# Patient Record
Sex: Female | Born: 1970 | Race: Black or African American | Hispanic: No | Marital: Single | Smoking: Never smoker
Health system: Southern US, Community
[De-identification: ages and names within clinical notes are randomized; demographics above are authoritative.]

## PROBLEM LIST (undated history)

## (undated) DIAGNOSIS — F32A Depression, unspecified: Secondary | ICD-10-CM

## (undated) DIAGNOSIS — F329 Major depressive disorder, single episode, unspecified: Secondary | ICD-10-CM

## (undated) DIAGNOSIS — F419 Anxiety disorder, unspecified: Secondary | ICD-10-CM

## (undated) HISTORY — PX: TUBAL LIGATION: SHX77

## (undated) HISTORY — PX: EYE MUSCLE SURGERY: SHX370

---

## 2001-02-28 ENCOUNTER — Emergency Department (HOSPITAL_COMMUNITY): Admission: EM | Admit: 2001-02-28 | Discharge: 2001-02-28 | Payer: Self-pay | Admitting: Emergency Medicine

## 2001-03-10 ENCOUNTER — Encounter: Admission: RE | Admit: 2001-03-10 | Discharge: 2001-04-04 | Payer: Self-pay | Admitting: Family Medicine

## 2001-03-19 ENCOUNTER — Other Ambulatory Visit: Admission: RE | Admit: 2001-03-19 | Discharge: 2001-03-19 | Payer: Self-pay | Admitting: Ophthalmology

## 2001-10-17 ENCOUNTER — Inpatient Hospital Stay (HOSPITAL_COMMUNITY): Admission: EM | Admit: 2001-10-17 | Discharge: 2001-10-20 | Payer: Self-pay | Admitting: Psychiatry

## 2003-05-05 ENCOUNTER — Other Ambulatory Visit: Admission: RE | Admit: 2003-05-05 | Discharge: 2003-05-05 | Payer: Self-pay | Admitting: Obstetrics and Gynecology

## 2003-12-06 ENCOUNTER — Inpatient Hospital Stay (HOSPITAL_COMMUNITY): Admission: AD | Admit: 2003-12-06 | Discharge: 2003-12-11 | Payer: Self-pay | Admitting: Obstetrics and Gynecology

## 2003-12-07 ENCOUNTER — Encounter (INDEPENDENT_AMBULATORY_CARE_PROVIDER_SITE_OTHER): Payer: Self-pay | Admitting: Specialist

## 2003-12-17 ENCOUNTER — Ambulatory Visit (HOSPITAL_COMMUNITY): Admission: RE | Admit: 2003-12-17 | Discharge: 2003-12-17 | Payer: Self-pay | Admitting: Obstetrics and Gynecology

## 2004-01-24 ENCOUNTER — Ambulatory Visit: Payer: Self-pay | Admitting: Internal Medicine

## 2008-02-08 ENCOUNTER — Inpatient Hospital Stay (HOSPITAL_COMMUNITY): Admission: AD | Admit: 2008-02-08 | Discharge: 2008-02-11 | Payer: Self-pay | Admitting: Obstetrics and Gynecology

## 2008-02-08 ENCOUNTER — Encounter (INDEPENDENT_AMBULATORY_CARE_PROVIDER_SITE_OTHER): Payer: Self-pay | Admitting: Obstetrics and Gynecology

## 2009-05-24 ENCOUNTER — Ambulatory Visit (HOSPITAL_COMMUNITY): Admission: RE | Admit: 2009-05-24 | Discharge: 2009-05-24 | Payer: Self-pay | Admitting: Obstetrics and Gynecology

## 2009-06-28 ENCOUNTER — Emergency Department (HOSPITAL_COMMUNITY): Admission: EM | Admit: 2009-06-28 | Discharge: 2009-06-28 | Payer: Self-pay | Admitting: Emergency Medicine

## 2009-07-25 ENCOUNTER — Encounter: Admission: RE | Admit: 2009-07-25 | Discharge: 2009-08-25 | Payer: Self-pay | Admitting: Family Medicine

## 2010-01-28 ENCOUNTER — Emergency Department (HOSPITAL_COMMUNITY)
Admission: EM | Admit: 2010-01-28 | Discharge: 2010-01-28 | Payer: Self-pay | Source: Home / Self Care | Admitting: Emergency Medicine

## 2010-04-04 LAB — CBC
HCT: 35.2 % — ABNORMAL LOW (ref 36.0–46.0)
Hemoglobin: 11.6 g/dL — ABNORMAL LOW (ref 12.0–15.0)
MCHC: 33 g/dL (ref 30.0–36.0)
MCV: 83.1 fL (ref 78.0–100.0)
Platelets: 233 10*3/uL (ref 150–400)
RBC: 4.23 MIL/uL (ref 3.87–5.11)
RDW: 15.2 % (ref 11.5–15.5)
WBC: 4.8 10*3/uL (ref 4.0–10.5)

## 2010-04-04 LAB — PREGNANCY, URINE: Preg Test, Ur: NEGATIVE

## 2010-05-01 LAB — CBC
HCT: 27.7 % — ABNORMAL LOW (ref 36.0–46.0)
HCT: 34.4 % — ABNORMAL LOW (ref 36.0–46.0)
Hemoglobin: 11.2 g/dL — ABNORMAL LOW (ref 12.0–15.0)
Hemoglobin: 9 g/dL — ABNORMAL LOW (ref 12.0–15.0)
MCHC: 32.5 g/dL (ref 30.0–36.0)
MCHC: 32.7 g/dL (ref 30.0–36.0)
MCV: 88.5 fL (ref 78.0–100.0)
MCV: 90 fL (ref 78.0–100.0)
Platelets: 165 10*3/uL (ref 150–400)
Platelets: 187 10*3/uL (ref 150–400)
RBC: 3.08 MIL/uL — ABNORMAL LOW (ref 3.87–5.11)
RBC: 3.89 MIL/uL (ref 3.87–5.11)
RDW: 14.8 % (ref 11.5–15.5)
RDW: 15.4 % (ref 11.5–15.5)
WBC: 11.7 10*3/uL — ABNORMAL HIGH (ref 4.0–10.5)
WBC: 8.6 10*3/uL (ref 4.0–10.5)

## 2010-05-01 LAB — RPR: RPR Ser Ql: NONREACTIVE

## 2010-05-30 NOTE — Op Note (Signed)
NAME:  [name], [name]                ACCOUNT NO.:  [id number]   MEDICAL RECORD NO.:  [id number]          PATIENT TYPE:  INP   LOCATION:  9108                          FACILITY:  [facility]   PHYSICIAN:  Hal Morales, M.D.DATE OF BIRTH:  [birth year]   DATE OF PROCEDURE:  02/08/2008  DATE OF DISCHARGE:                               OPERATIVE REPORT   PREOPERATIVE DIAGNOSES:  Intrauterine pregnancy at term, spontaneous  labor, spontaneous rupture of membranes, prior cesarean section x3,  desire for repeat cesarean section, and desire for surgical  sterilization.   POSTOPERATIVE DIAGNOSES:  Intrauterine pregnancy at term, spontaneous  labor, spontaneous rupture of membranes, prior cesarean section x3,  desire for repeat cesarean section, and desire for surgical  sterilization.   OPERATION:  Repeat low transverse cesarean section and bilateral tubal  sterilization.   SURGEON:  Hal Morales, MD   FIRST ASSISTANT:  Elby Showers. Mayford Knife, CNM   ANESTHESIA:  Spinal.   ESTIMATED BLOOD LOSS:  750 mL.   COMPLICATIONS:  None.   FINDINGS:  The patient was delivered of a female infant weighing 10 pounds  5 ounces with Apgars of 9 and 9 at 1 and 5 minutes respectively.  The  uterus, tubes, and ovaries were normal for the gravid state.   PROCEDURE IN DETAIL:  The patient was taken to the operating room after  appropriate identification, and placed on the operating table.  She had  received 2 g of Ancef prior to incision.  The abdomen and perineum were  prepped with multiple layers of Betadine and a Foley catheter was  inserted into the bladder under sterile conditions and connected to  straight drainage.  The abdomen was draped as a sterile field.  After  assurance of adequate anesthesia, the site of the previous cesarean  section incision was infiltrated with 20 mL of 0.25% Marcaine.  A  transverse incision was made at that site and the abdomen opened in  layers.  The peritoneum was entered  and the bladder blade placed.  The  uterus was incised approximately 2 cm above the uterovesical fold and  that incision taken laterally on either side bluntly.  The infant was  delivered from the occiput transverse position with the aid of a Kiwi  vacuum extractor.  The nares and pharynx were suctioned and the cord  clamped and cut.  The infant was handed off to the awaiting  pediatricians.  The appropriate cord blood was drawn and a true knot  noted in the umbilical cord.  The placenta was allowed to separate from  the uterus and was removed from the operative field.  Remaining  membranes were then removed with a Kelly clamp.  The uterine incision  was closed with a running interlocking suture of 0 Vicryl.  An  imbricating suture of 0 Vicryl was then placed.  Hemostatic sutures of 0  Vicryl were placed in a figure-of-eight fashion to achieve adequate  hemostasis.  The left fallopian tube was then identified, followed to  its fimbriated end, and grasped at the isthmic portion.  A suture of 2-0  chromic was placed through the mesosalpinx and tied fore and aft on the  knuckle of tube.  A second ligature was placed proximal to that and the  intervening knuckle of tube excised.  The cut ends were cauterized.  A  similar procedure was carried out on the opposite side.  Copious  irrigation of the peritoneal cavity was carried out and the abdominal  peritoneum closed with a running suture of 2-0 Vicryl.  The rectus  fascia was then closed with a running suture of 0 Vicryl and reinforced  on either side of midline with figure-of-eight sutures of 0 Vicryl after  the rectus muscles had been irrigated and made hemostatic with Bovie  cautery.  The subcutaneous tissue was irrigated and made hemostatic with  Bovie cautery.  The skin incision was closed with a subcuticular suture  of 3-0 Monocryl.  Steri-Strips were applied to the incision and a  sterile dressing applied.  The patient was then taken  from the operating  room to the recovery room in satisfactory condition having tolerated the  procedure well with sponge and instrument counts correct.  The placenta  went to [facility].  The infant went to the Full-Term Nursery.      Hal Morales, M.D.  Electronically Signed     VPH/MEDQ  D:  02/08/2008  T:  02/08/2008  Job:  [id number]

## 2010-05-30 NOTE — Discharge Summary (Signed)
[name], Parsons                ACCOUNT NO.:  [id number]   MEDICAL RECORD NO.:  [id number]          PATIENT TYPE:  INP   LOCATION:  9108                          [facility]ILITY:  [facility]   PHYSICIAN:  Janine Limbo, M.D.DATE OF BIRTH:  10/14/1970   DATE OF ADMISSION:  02/08/2008  DATE OF DISCHARGE:  02/11/2008                               DISCHARGE SUMMARY   Ms. [name] is a 40 year old gravida 4, para 3-0-0-3 at 66 and 5/7th  weeks who presented with complaints of leaking fluid and contractions.  She reports positive fetal movement and had nothing by mouth after  midnight.  Pregnancy has been remarkable for:  1. Late care.  2. Advanced maternal age.  3. Unsure dates.  4. Previous C-section x3.  5. HSV-1 and 2.  6. Polyhydramnios and LGA  7. Bilateral pyelectasis.  8. Also, the patient desired tubal sterilization.   The patient presented early in the morning of February 08, 2008, with  ruptured membranes and contractions.  Cervix was 1-2, 80%, and vertex -  3.  There was some clear light brown fluid noted.  There were no HSV  lesions noted.  She was having contractions every 2 minutes.  Fetal  heart rate was reassuring.  The decision was made to proceed with  cesarean section and tubal sterilization per the patient's request.  The  patient was taken to the operating room where Dr. Pennie Rushing performed a  repeat low transverse cesarean section and tubal sterilization.  Findings were a viable female, Apgars were 9 and 9, and weight was 10  pounds 5 ounces.  This was done under spinal anesthesia.  Infant was  taken to the full-term nursery.  Mother was taken to recovery in good  condition.  By postop day #1, the patient was doing well.  She was up ad  lib.  No syncope or dizziness.  She was bottle-feeding.  Her hemoglobin  was 9. down from 11.2, white blood cell count 11.7, and platelet count  of 165.  Her incision was clean, dry, and intact.  Fundus was firm.  Lochia was scant.   Orthostatics were done and were stable.  She was  recommended strongly iron supplementation.  Rest of the patient's  hospital stay was uncomplicated.  She again continued to have no syncope  or dizziness.  By postop day #3, she was doing well.  Infant was also  feeding well.  The patient's physical exam was within normal limits with  the fundus firm.  Incision was clean, dry, and intact.  Bleeding was  minimal.  She was deemed to receive full benefit of her hospital stay  and was discharged home.   DISCHARGE INSTRUCTIONS:  Per [facility] handout.   DISCHARGE MEDICATIONS:  1. Motrin 600 mg p.o. q.6 h. p.r.n. pain.  2. Percocet 1-2 p.o. q.3-4 h. p.r.n. pain.  3. Integra F 1 p.o. daily.   DISCHARGE FOLLOWUP:  Occur in 6 weeks at [facility] or p.r.n.      Renaldo Reel Emilee Hero, C.N.M.      Janine Limbo, M.D.  Electronically  Signed    VLL/MEDQ  D:  02/11/2008  T:  02/12/2008  Job:  [id number]

## 2010-05-30 NOTE — H&P (Signed)
[name], [name]                ACCOUNT NO.:  [id number]   MEDICAL RECORD NO.:  [id number]          PATIENT TYPE:  INP   LOCATION:  9108                          [facility]ILITY:  [facility]   PHYSICIAN:  Hal Morales, M.D.DATE OF BIRTH:  Dec 21, 1970   DATE OF ADMISSION:  02/08/2008  DATE OF DISCHARGE:                              HISTORY & PHYSICAL   This is a 40 year old gravida 4, para 3-0-0-3 at 27 and 5/7th weeks who  presents with complaints of leaking fluid and contractions.  She reports  positive fetal movement and she has been in nothing by mouth after  midnight.  Pregnancy has been remarkable for,  1. Late to care.  2. AMA.  3. Unsure dates.  4. Previous C-section x3.  5. HSV I and II.  6. Polyhydramnios and LGA.  7. Bilateral pyelectasis.   ALLERGIES:  None.   OB HISTORY:  Remarkable for C-section of a female infant in 37 at 7  weeks' gestation weighing 7 pounds 8 ounces.  She had a C-section in  1998 of a female infant at 15 weeks' gestation weighing 8 pounds 0  ounces and she had a C-section in 2005 of a female infant at 49 weeks'  gestation weighing 9 pounds 6 ounces.   MEDICAL HISTORY:  Remarkable for history of abnormal Pap in 2005,  history of Trichomonas in the past, childhood varicella, and history of  depression in the past, not requiring medications right now.   SURGICAL HISTORY:  Remarkable for C-section x3.   FAMILY HISTORY:  Remarkable for hypertension in her mother and brother,  TB in her grandmother, diabetes in her mother and several brothers,  seizures in her brother, bipolar in her brother.   GENETIC HISTORY:  Negative except for the patient's age of 52.   SOCIAL HISTORY:  The patient is single.  Father of baby, Geralyn Flash is involved and supportive.  She is of the [place] faith.  She denies any alcohol, tobacco, or drug use.   PRENATAL LABORATORIES:  Hemoglobin 11.7, platelets 170.  Blood type O  positive.  Antibody screen negative.  RPR  nonreactive.  Rubella immune.  Hepatitis negative.  HIV negative.  HSV I and II positive.  Hepatitis C  negative.   HISTORY OF CURRENT PREGNANCY:  The patient entered care at 42 weeks'  gestation.  She had a Glucola done that was normal.  She had ultrasound  at 32 weeks showing AFI of 95 percentile and LGA.  Another ultrasound at  33 weeks showed vertex presentation, BPP of 8/8, and a normal AFI.  BTL  papers were signed at 35 weeks, and she presents today in labor.   OBJECTIVE:  VITAL SIGNS:  Stable, afebrile.  HEENT:  Within normal limits.  NECK:  Thyroid normal not enlarged.  CHEST:  Clear to auscultation.  HEART:  Regular rate and rhythm.  ABDOMEN:  Gravid.  GU:  EFM shows nonreactive fetal heart rate with good variability and no  D cells.  Uterine contractions are every 2 minutes.  Cervix is 1-2, 80%,  -3, questionable vertex with a  difficult exam.  We will do bedside  ultrasound to confirm.  EXTREMITIES:  Within normal limits.  There are no HSV lesions.   ASSESSMENT:  1. Intrauterine pregnancy at 17 and 5/7th weeks.  2. Labor.  3. Prior cesarean section x3.  4. Desires bilateral tubal ligation with paper signed on January 14, 2008.   PLAN:  1. Admit to birthing suites per Dr. Pennie Rushing.  2. Routine preop orders.      Marie L. Williams, C.N.M.      Hal Morales, M.D.  Electronically Signed    MLW/MEDQ  D:  02/08/2008  T:  02/08/2008  Job:  [id number]

## 2010-06-02 NOTE — Discharge Summary (Signed)
[name], [name]                ACCOUNT NO.:  [id number]   MEDICAL RECORD NO.:  [id number]          PATIENT TYPE:  INP   LOCATION:  9119                          [facility]   PHYSICIAN:  Hal Morales, M.D.DATE OF BIRTH:  [birth year]   DATE OF ADMISSION:  12/06/2003  DATE OF DISCHARGE:  12/11/2003                                 DISCHARGE SUMMARY   ADMISSION DIAGNOSES:  1.  Intrauterine pregnancy at term.  2.  Pregnancy induced hypertension with proteinuria.  3.  Early labor.   DISCHARGE DIAGNOSES:  1.  Intrauterine pregnancy at term.  2.  Pregnancy induced hypertension with proteinuria.  3.  Early labor.  4.  Failure to progress in labor.  5.  Repeat cesarean section for delivery.  6.  Cystotomy repair.  7.  Bottle feeding.  8.  Resolved postoperative ileus.   PROCEDURE:  1.  Repeat low transverse cesarean section for delivery of a viable female      infant named [name] who had Apgar's of 9 and 9 and weighed 9 pounds 2      ounces. Attended in delivery by Dr. Osborn Coho and Wynelle Bourgeois,      C.N.M.  2.  Procedure was complicated by a cystotomy, which was repaired during the      surgery.   HOSPITAL COURSE:  Ms. [name] is a 40 year old gravida 3, para 2, 0/0/2 at 14  and 1/7 weeks admitted for observation initially secondary to elevated blood  pressures, who progressed in labor to 5 cm, desiring a vaginal birth attempt  after previous cesarean section. She got to 5 cm by 7:40 a.m. on December 07, 2003 and underwent amniotomy for augmentation and did indeed have  adequate MVU's for greater than 2 hours with no further cervical change and  was then recommended to proceed with a cesarean section for delivery. She  underwent the same for delivery of a viable female infant named [name] who had  Apgar's of 9 and 9 and weighed 9 pounds 2 ounces and was attended in  delivery by Dr. Osborn Coho and Wynelle Bourgeois, C.N.M. The procedure was  complicated by a  cystotomy that was repaired during the procedure and  subsequently, the patient has continued with a Foley bag for drainage while  her cystotomy is healing. She is now tolerating a regular diet and  ambulating without difficulty. She did have a CT scan on December 09, 2003  due to increasing abdominal pain postoperatively that showed a hematoma  within the right rectus muscle about 6 x 2 cm and a diffuse ileus and  gallstones and small bilateral pleural effusions. She also had a cystic mass  in the left upper quadrant, possibly pancreatic in origin and MRI is  recommended for further evaluation in the future. She has now been afebrile  for 48 hours and is deemed ready for discharge. She is bottle feeding  without difficulty. Her blood pressures have also been relatively stable.  She is now, as I said before, deemed ready for discharge.   DISCHARGE INSTRUCTIONS:  As per the [facility] handout with the addition to call for any nausea,  vomiting or fever or any issues with her Foley and Foley bag. She is to go  home with the Foley inserted at least 7 more days and will be given a leg  bag to go home with.   DISCHARGE MEDICATIONS:  1.  Hydrochlorothiazide 25 mg p.o. q.d.  2.  KCL 20 meq b.i.d. p.o.  3.  Ciprofloxacin 250 mg p.o. b.i.d. for 5 days.  4.  Tylox 1 to 2 p.o. q. 4 to 6 hours p.r.n. for pain.  5.  Ibuprofen 600 mg p.o. q. 6 hours p.r.n. for pain.  6.  She has also been give a prescription for Lo/Ovral birth control pills      to start in about 4 weeks.   DISCHARGE LABORATORY DATA:  Her potassium today is 3.0. Her hemoglobin on  discharge is 6.3. White blood cell count 10.4 and platelets are 214,000.   FOLLOW UP:  Will be an appointment for cystogram on December 17, 2003 at 9:00  a.m. at [facility] and immediately following the  cystogram, she is to have an appointment with Dr. Su Hilt at [facility]  and [facility]ologic Services.   CONDITION ON DISCHARGE:  Well and stable.     Shel   SJD/MEDQ  D:  12/11/2003  T:  12/11/2003  Job:  [id number]

## 2010-06-02 NOTE — H&P (Signed)
[name], [name]                ACCOUNT NO.:  [id number]   MEDICAL RECORD NO.:  [id number]          PATIENT TYPE:  INP   LOCATION:  9174                          [facility]ILITY[facility]  [facility]   PHYSICIAN:  Cam Hai, C.N.M.  DATE OF BIRTH:  1970/12/15   DATE OF ADMISSION:  12/06/2003  DATE OF DISCHARGE:                                HISTORY & PHYSICAL   Audio too short to transcribe (less than 5 seconds)      KS/MEDQ  D:  12/06/2003  T:  12/06/2003  Job:  [id number]

## 2010-06-02 NOTE — Discharge Summary (Signed)
NAME:  [name], [name] NO.:  [id number]   MEDICAL RECORD NO.:  [id number]                   PATIENT TYPE:  IPS   LOCATION:  0504                                 FACILITY:  [facility]   PHYSICIAN:  Jeanice Lim, M.D.              DATE OF BIRTH:  [birth year]   DATE OF ADMISSION:  10/17/2001  DATE OF DISCHARGE:  10/20/2001                                 DISCHARGE SUMMARY   IDENTIFYING DATA:  This is a 40 year old single African-American female  voluntarily admitted, presenting with a history of intentional overdose with  sleeping medicine with intention to kill himself.   MEDICATIONS:  Zoloft (which he felt was not effective).   ALLERGIES:  No known drug allergies.   PHYSICAL EXAMINATION:  Essentially within normal limits.  Neurologically  nonfocal.   LABORATORY DATA:  CBC within normal limits.  Alcohol level less than 5.  CMET within normal limits.  Urine drug screen negative.   MENTAL STATUS EXAM:  Alert, young, African-American female.  Cooperative,  calm with good eye contact.  Speech clear and articulate.  Mood depressed.  Affect flat, occasionally brightened.  Thought processes goal directed.  Thought content negative for dangerous ideation or psychotic symptoms.  Cognitively intact.  Judgment and insight fair.   ADMISSION DIAGNOSES:   AXIS I:  Major depressive disorder, single, severe.   AXIS II:  None.   AXIS III:  None.   AXIS IV:  Moderate (problems with primary support group, occupational  problems, economic problems).   AXIS V:  30/70.   HOSPITAL COURSE:  The patient was admitted and ordered routine p.r.n.  medications and underwent further monitoring.  Was encouraged to participate  in individual, group and milieu therapy.  The patient denied trying to kill  herself with the overdose.  She reported taking three 10 mg Ambien and then  calling a friend.  Seemed somewhat out of it and friend, therefore, assisted  her in getting to  the hospital.  The patient was quite appropriate on the  unit, participating fully in treatment and was titrated on Lexapro targeting  depressive symptoms.  The patient reported tolerance to medications, no  dangerous ideation.   CONDITION ON DISCHARGE:  Improved.  Mood was more euthymic.  Affect  brighter.  Thought processes goal directed.  Thought content negative for  dangerous ideation or psychotic symptoms.  The patient reported motivation  to be compliant with follow-up plan.    DISCHARGE MEDICATIONS:  1. Lexapro 10 mg q.a.m.  2. Vistaril 50 mg q.h.s. p.r.n.   FOLLOW UP:  Dr. Evelene Croon on October 23, 2001 at 4 p.m.   DISCHARGE DIAGNOSES:   AXIS I:  Major depressive disorder, single, severe.   AXIS II:  None.   AXIS III:  None.   AXIS IV:  Moderate (problems with primary support group, occupational  problems, economic problems).   AXIS V:  Global Assessment of Functioning on discharge 55.                                                Jeanice Lim, M.D.    JEM/MEDQ  D:  11/26/2001  T:  11/26/2001  Job:  [id number]

## 2010-06-02 NOTE — Op Note (Signed)
[name], [name]                ACCOUNT NO.:  [id number]   MEDICAL RECORD NO.:  [id number]          PATIENT TYPE:  INP   LOCATION:  9119                          [facility]   PHYSICIAN:  Osborn Coho, M.D.   DATE OF BIRTH:  01-01-1971   DATE OF PROCEDURE:  12/07/2003  DATE OF DISCHARGE:                                 OPERATIVE REPORT   PREOPERATIVE DIAGNOSES:  1.  Term intrauterine pregnancy.  2.  Repeat cesarean section.  3.  Attempted vaginal birth after cesarean delivery with failure to      progress.   POSTOPERATIVE DIAGNOSES:  1.  Term intrauterine pregnancy.  2.  Repeat cesarean section.  3.  Attempted vaginal birth after cesarean delivery with failure to      progress.  4.  Direct occiput posterior presentation.   PROCEDURE:  Repeat cesarean section with cystotomy repair.   ANESTHESIA:  Epidural.   ATTENDING:  Osborn Coho, M.D.   FLUIDS:  1500 mL.   ESTIMATED BLOOD LOSS:  1200 mL.   URINE OUTPUT:  250 mL.   COMPLICATIONS:  Less than or equal to 5 mm incidental cystotomy.   FINDINGS:  Live female infant with Apgars of 9 at one minute and 9 at five  minutes, Jayden, weight 9 pounds 2 ounces.   PROCEDURE:  The patient was taken to the operating room after the risks,  benefits, and alternatives discussed with the patient, patient verbalized  understanding, and consent signed and witnessed.  The patient was given a  surgical level via epidural and prepped and draped in the normal sterile  fashion.  A Pfannenstiel skin incision was made at the prior scar.  The  incision was carried down to the underlying layer of fascia, which was  excised bilaterally in the midline and extended bilaterally with the Mayo  scissors.  Kocher clamps were placed on the superior aspect of the fascial  incision and the rectus muscle excised from this fascia.  The same was done  on the inferior aspect of the fascial incision.  The muscles were separated  in the midline and the  peritoneum entered bluntly.  A bladder blade was  placed and bladder flap attempted; however, dense scar tissue of bladder to  lower uterine segment.  A decision was made to make uterine incision  approximately a centimeter above the upper edge of the bladder.  The uterine  incision was made and extended bilaterally with the bandage scissors.  Membranes were ruptured and infant was delivered in a direct OP position  with vacuum assistance secondary to very difficult to elevate the head  through the incision with just fundal pressure.  I think it was at that time  when some of the scar tissue tore away from between the lower uterine  segment and the bladder and less than a 5 mm incidental cystotomy performed.  The infant was delivered and handed to the waiting pediatricians.  Cord  bloods were collected.  The placenta was delivered via fundal massage and  sent to pathology.  The uterus was cleared of all clots and  debris.  The  uterine incision was repaired with 0 Vicryl in a running locked fashion.  At  this time sterile milk was placed through the Foley and incidental cystotomy  noted.  Cystotomy was repaired in three layers with interrupteds using 2-0  Vicryl on the first layer.  A second imbricating layer was performed and a  third layer using interrupteds was performed as well.  After the repair,  sterile milk was placed in the Foley once again and no leak noted at this  time.  A second imbricating layer was performed on the uterine incision  using 0 Vicryl.  The intra-abdominal cavity was copiously irrigated and the  right fallopian tube and ovary appeared normal and the left fallopian tube  and ovary palpated to within normal limits.  The peritoneum was closed with  3-0 chromic in a running fashion.  The fascia was repaired with 0 Vicryl in  a running fashion.  The subcutaneous tissue was irrigated and made  hemostatic with the Bovie.  A subcuticular stitch was performed on the skin   using 3-0 Monocryl.  Sponge, lap, and needle count was correct.  The patient  tolerated the procedure well and was returned to the recovery room in good  condition.  The Foley will remain for a total of 10 days, and the patient  will be scheduled to follow up in the office.     Ange   AR/MEDQ  D:  12/07/2003  T:  12/08/2003  Job:  [id number]
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
## 2010-06-02 NOTE — H&P (Signed)
[name], [name] NO.:  [id number]   MEDICAL RECORD NO.:  [id number]                   PATIENT TYPE:  IPS   LOCATION:  0504                                 FACILITY:  [facility]   PHYSICIAN:  Landry Corporal, N.P.                 DATE OF BIRTH:  July 05, 1970   DATE OF ADMISSION:  10/17/2001  DATE OF DISCHARGE:                         PSYCHIATRIC ADMISSION ASSESSMENT   IDENTIFYING INFORMATION:  A 40 year old single African-American female,  voluntarily admitted on October 17, 2001.   HISTORY OF PRESENT ILLNESS:  The patient presents with a history of  intentional overdose on her sleeping medication, took 3 of them at home.  She believes it was Ambien.  She was by herself.  She wrote a note to  anyone who found her.  The patient did call a friend, 911 was called and  the patient was then transported to the emergency department.  The patient  states her intention was to kill herself.  She was very stressed over her  finances, feels she is unable to provide for her children.  She is a single  mother.  She has little social support.  She reports that she feels she is  just tired and feels like a failure.  Her sleep has been satisfactory.  Her  appetite has been satisfactory.  She is denying any psychosis and promises  safety on the unit.   PAST PSYCHIATRIC HISTORY:  First hospitalization to [facility], no other inpatient admissions.  No outpatient treatment, no history  of a suicide attempt.  The patient was to make an appointment with a  psychiatrist but did not follow through.   SOCIAL HISTORY:  She is a 40 year old single African-American female with 2  children, ages 27 and 7.  She lives with her children.  Children are  currently with a friend.  The patient has a court date pending for failure  to appear in regards to her driver's license.  Completed 12th grade.  She  works in Clinical biochemist for [employer].   FAMILY HISTORY:  Both her parents  are deceased, unknown psychiatric history.   ALCOHOL DRUG HISTORY:  Nonsmoker, denies any alcohol or substance abuse.   PAST MEDICAL HISTORY:  Primary care Makenna Macaluso is Dr. Alwyn Pea at the  [facility] in [place].  Medical problems are none.   MEDICATIONS:  The patient has been on Zoloft 50 mg, has been on that for  approximately 3 weeks, been off for it for 1 month, found it ineffective,  and a sleeping medication.   DRUG ALLERGIES:  No known allergies.   PHYSICAL EXAMINATION:  Performed at [facility].  The  patient appears a thin, well-nourished female in no acute distress.  Laboratory data:  CBC within normal limits.  Urine pregnancy test was  negative.  CMET within normal limits.  Alcohol level less than 5.  Urinalysis showed positive ketones.  Urine drug screen was negative.  CMET:  Protein elevated at 8.9.   MENTAL STATUS EXAM:  She is an alert, young African-American female,  cooperative with good eye contact, calm.  Speech is clear and articulate.  Mood is depressed, affect is flat with moments of brightness.  Thought  processes are coherent, no evidence of psychosis, no auditory or visual  hallucinations, no suicidal or homicidal ideation, no paranoia.  Cognitive  function intact, memory is good, judgment and insight is fair.  Appears to  have an average to above average intelligence.   ADMISSION DIAGNOSES:   AXIS I:  Major depression with a suicide gesture.   AXIS II:  Deferred.   AXIS III:  None.   AXIS IV:  Problems with primary support group, lack of support, and grief  related to mother's death of last year, occupation, economic, other  psychosocial problems.   AXIS V:  Current is 30, this past year 71-75.   PLAN:  Voluntary admission for intention overdose.  Contract for safety,  check every 15 minutes.  Will obtain labs.  Will initiate Lexapro to  stabilize her mood and thinking.  Medication compliance was discussed.  The   patient to follow up with a private psychiatrist or mental health.  Increase  her coping skills by attending groups.   TENTATIVE LENGTH OF CARE:  4-5 days.                                                Landry Corporal, N.P.    JO/MEDQ  D:  10/18/2001  T:  10/19/2001  Job:  [id number]

## 2010-09-01 ENCOUNTER — Other Ambulatory Visit: Payer: Self-pay | Admitting: Obstetrics and Gynecology

## 2010-11-16 HISTORY — PX: HYSTERECTOMY ABDOMINAL WITH SALPINGECTOMY: SHX6725

## 2010-12-04 ENCOUNTER — Other Ambulatory Visit (HOSPITAL_COMMUNITY): Payer: Managed Care, Other (non HMO)

## 2010-12-05 ENCOUNTER — Encounter (HOSPITAL_COMMUNITY): Payer: Self-pay | Admitting: Pharmacist

## 2010-12-05 NOTE — H&P (Signed)
NAME:  [name], [name]                     ACCOUNT NO.:  MEDICAL RECORD NO.:  [id number]  LOCATION:                                 FACILITY:  PHYSICIAN:  Hal Morales, M.D.DATE OF BIRTH:  1970/07/30  DATE OF ADMISSION:  12/05/2010 DATE OF DISCHARGE:                             HISTORY & PHYSICAL   HISTORY OF PRESENT ILLNESS:  Ms. Komar is a 40 year old single African American female, para 4-0-0-4, presenting for a robot assisted hysterectomy because of menorrhagia and dysmenorrhea.  In 2010, the patient discontinued oral contraceptives due to having undergone a tubal ligation and since that time has had heavy and very uncomfortable menses.  Initially the patient's flow would lasts as long as 2 weeks with the first 7 days being so heavy she had to change her pad every 30 minutes to 1 hour.  The remaining 7 days would be characterized by spotting.  She goes on to say that 2 weeks prior to her flow she  experienced cramping that she rated as a 10/10 on a 10 point pain scale. However, she would find some relief with ibuprofen 400 mg.  Occasionally the patient will experience cramping after her menses as well.  In 2011, the patient underwent NovaSure endometrial ablation, and though her periods were not as heavy they would still last approximately 7 days along with cramping.  She denies any dyspareunia, changes in her bowel movements, urinary tract symptoms, vaginitis symptoms, nausea, vomiting, or diarrhea.  A pelvic ultrasound and sonohysterogram in 2011 showed a uterus measuring 10.2 x 6.50 x 4.81 cm with no observed endometrial masses.  Her right ovary measured 2.83 x 3.49 x 1.80 cm. Left ovary measured 2.90 x 2.19 x 2.82 cm.  An endometrial biopsy done at that same time revealed small fragments of benign proliferative endometrium, but no hyperplasia, atypia, or malignancy was identified. The patient's TSH was also found to be normal at that time.  A review of both medical and  surgical management options were given to the patient, however, due to the fact she has completed her childbearing, she desires to proceed with definitive therapy in the form of hysterectomy.  OB HISTORY:  Gravida 4, para 4-0-0-4.  The patient had a cesarean section in 1990, 1998, 2005, and 2010.  GYN HISTORY:  Menarche at 40 year old.  Last menstrual period on November 24, 2010.  The patient uses tubal ligation as her method of contraception.  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  She has a remote history of an abnormal Pap smear, however, her Pap smears have been normal since, with the most recent being in July, 2012.  PAST MEDICAL HISTORY:  Positive for depression.  The patient also had a suicide attempt in 2003.  SURGICAL HISTORY:  In 1976, eye surgery for strabismus.  In 2010, bilateral tubal ligation.  In 2011, NovaSure endometrial ablation.  She denies any problems with anesthesia, and denies any history of blood transfusions.  FAMILY HISTORY:  Hypertension, tuberculosis, diabetes mellitus, seizure disorder, stroke and bipolar disorder.  SOCIAL HISTORY:  The patient is single and she works in  customer service for [employer]  HABITS:  She does not use alcohol, tobacco, or illicit drugs.  CURRENT ALLERGIES:  None.  ALLERGIES:  She has no known drug allergies.  She denies any sensitivities to latex, soy, peanuts, or shellfish.  REVIEW OF SYSTEMS:  The patient wears  glasses and contact lenses. She denies any headache, vision changes, difficulty swallowing, nasal congestion, nausea, vomiting, diarrhea, chest pain, shortness of breath, urinary frequency, urgency, dysuria, hematochezia, myalgias, arthralgias, skin rashes, and except as is mentioned in history of present illness.  The patient's review of systems is otherwise negative.  PHYSICAL EXAMINATION:  VITAL SIGNS:  Blood pressure 110/70, pulse is 72, respirations 18, temperature  98.8 degrees Fahrenheit orally, weight is 166 pounds.  Height is 5 feet 6-3/4 inches tall, body mass index is 27. NECK:  Supple without masses.  There is no thyromegaly or cervical adenopathy. HEART:  Regular rate and rhythm. LUNGS:  Clear. BACK:  No CVA tenderness. ABDOMEN:  No tenderness, guarding, rebound, masses, or organomegaly. EXTREMITIES:  No clubbing, cyanosis, or edema. PELVIC:  EGBUS is normal.  Vagina was normal with blood noticed in the vaginal vault.  Cervix is nontender without lesions.  Uterus appears upper limits of normal size.  It is mobile without tenderness.  Adnexa without tenderness or masses.  Rectovaginal without any masses.  IMPRESSION: 1. Menorrhagia. 2. Dysmenorrhea 3. Status post NovaSure endometrial ablation.  DISPOSITION:  Discussion was held with the patient regarding indications for her procedure along with its risks, which include, but are not limited to, reaction to anesthesia, damage to adjacent organs, infection, excessive bleeding, and the possible need for an open abdominal incision.  The patient was further advised that she will experience transient postoperative facial edema, that the robotic approach requires more time than that of an open approach, her hospital stay is expected to be 0-2 days, and that she should be able to return to her usual activities in 2-3 weeks.  The patient was given a MiraLax bowel prep to be completed 24 hours prior to her procedure.  The patient verbalized understanding of these risk and instructions and wishes to proceed with a robot-assisted hysterectomy with the possibility of a total abdominal hysterectomy.  The possibility of endometriosis as a cause of her painful, heavy periods could affect her ovaries as well, so the possible need to remove one or both ovaries exists.  This will be performed at [facility] on December 13, 2010, at 10:00 a.m.     Alyxander Kollmann J. Lowell Guitar,  P.A.-C   ______________________________ Hal Morales, M.D.    EJP/MEDQ  D:  12/05/2010  T:  12/05/2010  Job:  [id number]

## 2010-12-06 ENCOUNTER — Encounter (HOSPITAL_COMMUNITY)
Admission: RE | Admit: 2010-12-06 | Discharge: 2010-12-06 | Disposition: A | Discharge status: Home / Self Care | Payer: Managed Care, Other (non HMO) | Source: Ambulatory Visit

## 2010-12-06 HISTORY — DX: Major depressive disorder, single episode, unspecified: F32.9

## 2010-12-06 HISTORY — DX: Depression, unspecified: F32.A

## 2010-12-08 ENCOUNTER — Encounter (HOSPITAL_COMMUNITY): Admission: RE | Admit: 2010-12-08 | Payer: Managed Care, Other (non HMO) | Source: Ambulatory Visit

## 2010-12-08 ENCOUNTER — Encounter (HOSPITAL_COMMUNITY): Payer: Self-pay

## 2010-12-08 LAB — SURGICAL PCR SCREEN
MRSA, PCR: NEGATIVE
Staphylococcus aureus: NEGATIVE

## 2010-12-08 LAB — CBC
HCT: 39.8 % (ref 36.0–46.0)
Hemoglobin: 12.6 g/dL (ref 12.0–15.0)
MCH: 29.6 pg (ref 26.0–34.0)
MCHC: 31.7 g/dL (ref 30.0–36.0)
MCV: 93.4 fL (ref 78.0–100.0)
Platelets: 192 10*3/uL (ref 150–400)
RBC: 4.26 MIL/uL (ref 3.87–5.11)
RDW: 14.3 % (ref 11.5–15.5)
WBC: 4.9 10*3/uL (ref 4.0–10.5)

## 2010-12-08 NOTE — Patient Instructions (Addendum)
   Your procedure is scheduled on: Wednesday, Nov 28th  Enter through the [employer] of [facility] at:  6:45am Pick up the phone at the desk and dial [phone] and inform us of your arrival.  Please call this number if you have any problems the morning of surgery: [phone]  Remember: Do not eat food after midnight: Tuesday Do not drink clear liquids after: Tuesday Take these medicines the morning of surgery with a SIP OF WATER: none  Do not wear jewelry, make-up, or FINGER nail polish Do not wear lotions, powders, or perfumes.  You may not  wear deodorant. Do not shave 48 hours prior to surgery. Do not bring valuables to the hospital.  Leave suitcase in the car. After Surgery it may be brought to your room. For patients being admitted to the hospital, checkout time is 11:00am the day of discharge.  Home with boyfriend Donia Guiles  cell  [phone].  Remember to use your hibiclens as instructed.Please shower with 1/2 bottle the evening before your surgery and the other 1/2 bottle the morning of surgery.

## 2010-12-08 NOTE — Pre-Procedure Instructions (Signed)
Ok to see patient DOS. 

## 2010-12-12 ENCOUNTER — Other Ambulatory Visit: Payer: Self-pay | Admitting: Obstetrics and Gynecology

## 2010-12-12 MED ORDER — DEXTROSE 5 % IV SOLN
2.0000 g | INTRAVENOUS | Status: AC
  Administered 2010-12-13: 2 g via INTRAVENOUS
  Filled 2010-12-12: qty 2

## 2010-12-13 ENCOUNTER — Other Ambulatory Visit: Payer: Self-pay | Admitting: Obstetrics and Gynecology

## 2010-12-13 ENCOUNTER — Encounter (HOSPITAL_COMMUNITY): Payer: Self-pay | Admitting: *Deleted

## 2010-12-13 ENCOUNTER — Ambulatory Visit (HOSPITAL_COMMUNITY): Payer: Managed Care, Other (non HMO) | Admitting: Anesthesiology

## 2010-12-13 ENCOUNTER — Encounter (HOSPITAL_COMMUNITY): Payer: Self-pay | Admitting: Anesthesiology

## 2010-12-13 ENCOUNTER — Encounter (HOSPITAL_COMMUNITY)
Admission: RE | Disposition: A | Discharge status: Home / Self Care | Payer: Self-pay | Source: Ambulatory Visit | Attending: Obstetrics and Gynecology

## 2010-12-13 ENCOUNTER — Ambulatory Visit (HOSPITAL_COMMUNITY)
Admission: RE | Admit: 2010-12-13 | Discharge: 2010-12-14 | Disposition: A | Discharge status: Home / Self Care | Payer: Managed Care, Other (non HMO) | Source: Ambulatory Visit | Attending: Obstetrics and Gynecology | Admitting: Obstetrics and Gynecology

## 2010-12-13 DIAGNOSIS — N946 Dysmenorrhea, unspecified: Secondary | ICD-10-CM | POA: Diagnosis present

## 2010-12-13 DIAGNOSIS — IMO0002 Reserved for concepts with insufficient information to code with codable children: Secondary | ICD-10-CM | POA: Insufficient documentation

## 2010-12-13 DIAGNOSIS — Z01812 Encounter for preprocedural laboratory examination: Secondary | ICD-10-CM | POA: Insufficient documentation

## 2010-12-13 DIAGNOSIS — N949 Unspecified condition associated with female genital organs and menstrual cycle: Secondary | ICD-10-CM | POA: Insufficient documentation

## 2010-12-13 DIAGNOSIS — N920 Excessive and frequent menstruation with regular cycle: Secondary | ICD-10-CM | POA: Insufficient documentation

## 2010-12-13 DIAGNOSIS — Z01818 Encounter for other preprocedural examination: Secondary | ICD-10-CM | POA: Insufficient documentation

## 2010-12-13 DIAGNOSIS — D259 Leiomyoma of uterus, unspecified: Secondary | ICD-10-CM

## 2010-12-13 DIAGNOSIS — D251 Intramural leiomyoma of uterus: Secondary | ICD-10-CM | POA: Insufficient documentation

## 2010-12-13 DIAGNOSIS — Y9210 Unspecified residential institution as the place of occurrence of the external cause: Secondary | ICD-10-CM | POA: Insufficient documentation

## 2010-12-13 DIAGNOSIS — S3720XA Unspecified injury of bladder, initial encounter: Secondary | ICD-10-CM

## 2010-12-13 HISTORY — PX: CYSTOSCOPY: SHX5120

## 2010-12-13 LAB — BLOOD GAS, ARTERIAL
Acid-base deficit: 3.9 mmol/L — ABNORMAL HIGH (ref 0.0–2.0)
Bicarbonate: 20.5 mEq/L (ref 20.0–24.0)
Drawn by: 131
FIO2: 0.53 %
MECHVT: 650 mL
O2 Saturation: 100 %
PEEP: 5 cmH2O
RATE: 12 resp/min
TCO2: 21.6 mmol/L (ref 0–100)
pCO2 arterial: 37.1 mmHg (ref 35.0–45.0)
pH, Arterial: 7.361 (ref 7.350–7.400)
pO2, Arterial: 232 mmHg — ABNORMAL HIGH (ref 80.0–100.0)

## 2010-12-13 LAB — PREGNANCY, URINE: Preg Test, Ur: NEGATIVE

## 2010-12-13 SURGERY — ROBOTIC ASSISTED SUPRACERVICAL HYSTERECTOMY
Anesthesia: General | Wound class: Clean Contaminated

## 2010-12-13 MED ORDER — KETOROLAC TROMETHAMINE 30 MG/ML IJ SOLN
INTRAMUSCULAR | Status: AC
  Administered 2010-12-13: 30 mg via INTRAVENOUS
  Filled 2010-12-13: qty 1

## 2010-12-13 MED ORDER — SUFENTANIL CITRATE 50 MCG/ML IV SOLN
INTRAVENOUS | Status: AC
  Filled 2010-12-13: qty 1

## 2010-12-13 MED ORDER — OXYCODONE-ACETAMINOPHEN 5-325 MG PO TABS
1.0000 | ORAL_TABLET | ORAL | Status: DC | PRN
  Administered 2010-12-14 (×2): 2 via ORAL
  Filled 2010-12-13 (×3): qty 2

## 2010-12-13 MED ORDER — DEXTROSE 5 % IV SOLN
2.0000 g | Freq: Once | INTRAVENOUS | Status: AC
  Administered 2010-12-13: 2 g via INTRAVENOUS
  Filled 2010-12-13: qty 2

## 2010-12-13 MED ORDER — MIDAZOLAM HCL 2 MG/2ML IJ SOLN
INTRAMUSCULAR | Status: AC
  Filled 2010-12-13: qty 2

## 2010-12-13 MED ORDER — ROCURONIUM BROMIDE 100 MG/10ML IV SOLN
INTRAVENOUS | Status: DC | PRN
  Administered 2010-12-13: 10 mg via INTRAVENOUS
  Administered 2010-12-13 (×4): 20 mg via INTRAVENOUS
  Administered 2010-12-13 (×2): 10 mg via INTRAVENOUS
  Administered 2010-12-13: 50 mg via INTRAVENOUS
  Administered 2010-12-13: 10 mg via INTRAVENOUS

## 2010-12-13 MED ORDER — IBUPROFEN 600 MG PO TABS: 600.0000 mg | ORAL_TABLET | Freq: Four times a day (QID) | ORAL | Status: DC | PRN

## 2010-12-13 MED ORDER — HYDROMORPHONE HCL PF 1 MG/ML IJ SOLN
INTRAMUSCULAR | Status: AC
  Administered 2010-12-13: 0.5 mg via INTRAVENOUS
  Filled 2010-12-13: qty 1

## 2010-12-13 MED ORDER — DEXAMETHASONE SODIUM PHOSPHATE 10 MG/ML IJ SOLN
INTRAMUSCULAR | Status: DC | PRN
  Administered 2010-12-13: 10 mg via INTRAVENOUS

## 2010-12-13 MED ORDER — ONDANSETRON HCL 4 MG/2ML IJ SOLN
INTRAMUSCULAR | Status: AC
  Filled 2010-12-13: qty 2

## 2010-12-13 MED ORDER — LACTATED RINGERS IR SOLN
Status: DC | PRN
  Administered 2010-12-13: 1

## 2010-12-13 MED ORDER — LACTATED RINGERS IV SOLN
INTRAVENOUS | Status: DC
  Administered 2010-12-13 – 2010-12-14 (×2): via INTRAVENOUS

## 2010-12-13 MED ORDER — HYDROMORPHONE HCL PF 1 MG/ML IJ SOLN
INTRAMUSCULAR | Status: AC
Start: 2010-12-13 — End: 2010-12-13
  Administered 2010-12-13: 0.5 mg via INTRAVENOUS
  Filled 2010-12-13: qty 1

## 2010-12-13 MED ORDER — HETASTARCH-ELECTROLYTES 6 % IV SOLN
INTRAVENOUS | Status: DC | PRN
  Administered 2010-12-13 (×2): via INTRAVENOUS

## 2010-12-13 MED ORDER — DEXTROSE 5 % IV SOLN: 2.0000 g | INTRAVENOUS | Status: DC

## 2010-12-13 MED ORDER — HYDROMORPHONE HCL PF 1 MG/ML IJ SOLN
0.2500 mg | INTRAMUSCULAR | Status: DC | PRN
  Administered 2010-12-13 (×4): 0.5 mg via INTRAVENOUS

## 2010-12-13 MED ORDER — KETOROLAC TROMETHAMINE 30 MG/ML IJ SOLN
30.0000 mg | Freq: Four times a day (QID) | INTRAMUSCULAR | Status: DC
  Administered 2010-12-13 – 2010-12-14 (×3): 30 mg via INTRAVENOUS
  Filled 2010-12-13 (×2): qty 1

## 2010-12-13 MED ORDER — HYDROMORPHONE HCL PF 1 MG/ML IJ SOLN
1.0000 mg | INTRAMUSCULAR | Status: DC | PRN
  Administered 2010-12-13: 1 mg via INTRAVENOUS
  Filled 2010-12-13: qty 1

## 2010-12-13 MED ORDER — ROCURONIUM BROMIDE 50 MG/5ML IV SOLN
INTRAVENOUS | Status: AC
  Filled 2010-12-13: qty 1

## 2010-12-13 MED ORDER — MENTHOL 3 MG MT LOZG: 1.0000 | LOZENGE | OROMUCOSAL | Status: DC | PRN

## 2010-12-13 MED ORDER — LACTATED RINGERS IV SOLN
INTRAVENOUS | Status: DC
  Administered 2010-12-13 (×2): via INTRAVENOUS

## 2010-12-13 MED ORDER — LIDOCAINE HCL (CARDIAC) 20 MG/ML IV SOLN
INTRAVENOUS | Status: DC | PRN
  Administered 2010-12-13: 60 mg via INTRAVENOUS

## 2010-12-13 MED ORDER — MIDAZOLAM HCL 5 MG/5ML IJ SOLN
INTRAMUSCULAR | Status: DC | PRN
  Administered 2010-12-13: 2 mg via INTRAVENOUS

## 2010-12-13 MED ORDER — NEOSTIGMINE METHYLSULFATE 1 MG/ML IJ SOLN
INTRAMUSCULAR | Status: AC
  Filled 2010-12-13: qty 10

## 2010-12-13 MED ORDER — BUPIVACAINE HCL (PF) 0.25 % IJ SOLN
INTRAMUSCULAR | Status: DC | PRN
  Administered 2010-12-13: 10 mL

## 2010-12-13 MED ORDER — GLYCOPYRROLATE 0.2 MG/ML IJ SOLN
INTRAMUSCULAR | Status: DC | PRN
  Administered 2010-12-13: .4 mg via INTRAVENOUS

## 2010-12-13 MED ORDER — HETASTARCH-ELECTROLYTES 6 % IV SOLN
INTRAVENOUS | Status: AC
  Filled 2010-12-13: qty 500

## 2010-12-13 MED ORDER — NEOSTIGMINE METHYLSULFATE 1 MG/ML IJ SOLN
INTRAMUSCULAR | Status: DC | PRN
  Administered 2010-12-13: 2 mg via INTRAVENOUS

## 2010-12-13 MED ORDER — DOCUSATE SODIUM 100 MG PO CAPS
100.0000 mg | ORAL_CAPSULE | Freq: Every day | ORAL | Status: DC
  Administered 2010-12-13 – 2010-12-14 (×2): 100 mg via ORAL
  Filled 2010-12-13 (×3): qty 1

## 2010-12-13 MED ORDER — LIDOCAINE HCL (CARDIAC) 20 MG/ML IV SOLN
INTRAVENOUS | Status: AC
  Filled 2010-12-13: qty 5

## 2010-12-13 MED ORDER — PROPOFOL 10 MG/ML IV EMUL
INTRAVENOUS | Status: DC | PRN
  Administered 2010-12-13: 50 mg via INTRAVENOUS
  Administered 2010-12-13: 150 mg via INTRAVENOUS
  Administered 2010-12-13: 50 mg via INTRAVENOUS

## 2010-12-13 MED ORDER — GLYCOPYRROLATE 0.2 MG/ML IJ SOLN
INTRAMUSCULAR | Status: AC
  Filled 2010-12-13: qty 2

## 2010-12-13 MED ORDER — ARTIFICIAL TEARS OP OINT
TOPICAL_OINTMENT | OPHTHALMIC | Status: DC | PRN
  Administered 2010-12-13: 1 via OPHTHALMIC

## 2010-12-13 MED ORDER — ONDANSETRON HCL 4 MG/2ML IJ SOLN
INTRAMUSCULAR | Status: DC | PRN
  Administered 2010-12-13: 4 mg via INTRAVENOUS

## 2010-12-13 MED ORDER — SCOPOLAMINE 1 MG/3DAYS TD PT72
MEDICATED_PATCH | TRANSDERMAL | Status: AC
  Administered 2010-12-13: 1 via TRANSDERMAL
  Filled 2010-12-13: qty 1

## 2010-12-13 MED ORDER — INDIGOTINDISULFONATE SODIUM 8 MG/ML IJ SOLN
INTRAMUSCULAR | Status: DC | PRN
  Administered 2010-12-13: 5 mL via INTRAVENOUS

## 2010-12-13 MED ORDER — ONDANSETRON HCL 4 MG/2ML IJ SOLN
4.0000 mg | Freq: Three times a day (TID) | INTRAMUSCULAR | Status: DC | PRN
  Administered 2010-12-13: 4 mg via INTRAVENOUS
  Filled 2010-12-13: qty 2

## 2010-12-13 MED ORDER — SUFENTANIL CITRATE 50 MCG/ML IV SOLN
INTRAVENOUS | Status: DC | PRN
  Administered 2010-12-13: 5 ug via INTRAVENOUS
  Administered 2010-12-13: 15 ug via INTRAVENOUS
  Administered 2010-12-13 (×2): 5 ug via INTRAVENOUS
  Administered 2010-12-13 (×4): 10 ug via INTRAVENOUS
  Administered 2010-12-13: 5 ug via INTRAVENOUS
  Administered 2010-12-13: 10 ug via INTRAVENOUS
  Administered 2010-12-13: 5 ug via INTRAVENOUS
  Administered 2010-12-13: 10 ug via INTRAVENOUS

## 2010-12-13 MED ORDER — INDIGOTINDISULFONATE SODIUM 8 MG/ML IJ SOLN
INTRAMUSCULAR | Status: AC
  Filled 2010-12-13: qty 5

## 2010-12-13 MED ORDER — PROPOFOL 10 MG/ML IV EMUL
INTRAVENOUS | Status: AC
  Filled 2010-12-13: qty 40

## 2010-12-13 MED ORDER — DEXAMETHASONE SODIUM PHOSPHATE 10 MG/ML IJ SOLN
INTRAMUSCULAR | Status: AC
  Filled 2010-12-13: qty 1

## 2010-12-13 MED ORDER — ONDANSETRON HCL 4 MG PO TABS
4.0000 mg | ORAL_TABLET | Freq: Three times a day (TID) | ORAL | Status: DC | PRN
Start: 2010-12-13 — End: 2010-12-14

## 2010-12-13 SURGICAL SUPPLY — 116 items
BAG URINE DRAINAGE (UROLOGICAL SUPPLIES) ×4 IMPLANT
BARRIER ADHS 3X4 INTERCEED (GAUZE/BANDAGES/DRESSINGS) ×4 IMPLANT
BENZOIN TINCTURE PRP APPL 2/3 (GAUZE/BANDAGES/DRESSINGS) ×4 IMPLANT
BLADE HEX COATED 2.75 (ELECTRODE) IMPLANT
BLADE LAP MORCELLATOR 15X9.5 (ELECTROSURGICAL) ×4 IMPLANT
BLADELESS LONG 8MM (BLADE) ×4 IMPLANT
CABLE HIGH FREQUENCY MONO STRZ (ELECTRODE) ×4 IMPLANT
CANISTER SUCTION 2500CC (MISCELLANEOUS) ×4 IMPLANT
CATH FOLEY 3WAY  5CC 16FR (CATHETERS) ×1
CATH FOLEY 3WAY  5CC 18FR (CATHETERS) ×1
CATH FOLEY 3WAY 5CC 16FR (CATHETERS) ×3 IMPLANT
CATH FOLEY 3WAY 5CC 18FR (CATHETERS) ×3 IMPLANT
CATH ROBINSON RED A/P 16FR (CATHETERS) IMPLANT
CHLORAPREP W/TINT 26ML (MISCELLANEOUS) ×4 IMPLANT
CLOTH BEACON ORANGE TIMEOUT ST (SAFETY) ×4 IMPLANT
CONT PATH 16OZ SNAP LID 3702 (MISCELLANEOUS) ×4 IMPLANT
CONT SPECI 4OZ STER CLIK (MISCELLANEOUS) IMPLANT
COVER MAYO STAND STRL (DRAPES) ×8 IMPLANT
COVER TABLE BACK 60X90 (DRAPES) ×8 IMPLANT
COVER TIP SHEARS 8 DVNC (MISCELLANEOUS) ×3 IMPLANT
COVER TIP SHEARS 8MM DA VINCI (MISCELLANEOUS) ×1
DECANTER SPIKE VIAL GLASS SM (MISCELLANEOUS) ×4 IMPLANT
DERMABOND ADVANCED (GAUZE/BANDAGES/DRESSINGS) ×1
DERMABOND ADVANCED .7 DNX12 (GAUZE/BANDAGES/DRESSINGS) ×3 IMPLANT
DRAIN JACKSON PRT FLT 7MM (DRAIN) IMPLANT
DRAPE HUG U DISPOSABLE (DRAPE) ×4 IMPLANT
DRAPE LG THREE QUARTER DISP (DRAPES) ×8 IMPLANT
DRAPE MONITOR DA VINCI (DRAPE) ×4 IMPLANT
DRAPE PROXIMA HALF (DRAPES) IMPLANT
DRAPE ROBOTICS STRL (DRAPES) ×4 IMPLANT
DRAPE UTILITY XL STRL (DRAPES) IMPLANT
DRAPE WARM FLUID 44X44 (DRAPE) ×4 IMPLANT
ELECT NEEDLE TIP 2.8 STRL (NEEDLE) IMPLANT
ELECT REM PT RETURN 9FT ADLT (ELECTROSURGICAL) ×4
ELECTRODE REM PT RTRN 9FT ADLT (ELECTROSURGICAL) ×3 IMPLANT
EVACUATOR SILICONE 100CC (DRAIN) IMPLANT
EVACUATOR SMOKE 8.L (FILTER) ×8 IMPLANT
FORCEPS CUTTING 33CM 5MM (CUTTING FORCEPS) IMPLANT
FORCEPS CUTTING 45CM 5MM (CUTTING FORCEPS) IMPLANT
FORMULA 20CAL 3 OZ MEAD (FORMULA) ×4 IMPLANT
GAUZE PACKING 2X5 YD STERILE (GAUZE/BANDAGES/DRESSINGS) IMPLANT
GAUZE SPONGE 4X4 16PLY XRAY LF (GAUZE/BANDAGES/DRESSINGS) IMPLANT
GAUZE VASELINE 3X9 (GAUZE/BANDAGES/DRESSINGS) IMPLANT
GLOVE BIO SURGEON STRL SZ 6.5 (GLOVE) ×8 IMPLANT
GLOVE BIOGEL PI IND STRL 6.5 (GLOVE) ×3 IMPLANT
GLOVE BIOGEL PI INDICATOR 6.5 (GLOVE) ×1
GLOVE ECLIPSE 6.5 STRL STRAW (GLOVE) IMPLANT
GLOVE SURG SS PI 6.5 STRL IVOR (GLOVE) ×12 IMPLANT
GOWN PREVENTION PLUS LG XLONG (DISPOSABLE) ×16 IMPLANT
GOWN SURGICAL XLG (GOWNS) ×16 IMPLANT
KIT DISP ACCESSORY 4 ARM (KITS) ×4 IMPLANT
NEEDLE HYPO 25X1 1.5 SAFETY (NEEDLE) IMPLANT
NEEDLE MAYO .5 CIRCLE (NEEDLE) ×4 IMPLANT
NEEDLE SPNL 22GX3.5 QUINCKE BK (NEEDLE) IMPLANT
NS IRRIG 1000ML POUR BTL (IV SOLUTION) ×12 IMPLANT
OCCLUDER COLPOPNEUMO (BALLOONS) ×4 IMPLANT
PACK ABDOMINAL GYN (CUSTOM PROCEDURE TRAY) IMPLANT
PACK LAVH (CUSTOM PROCEDURE TRAY) ×8 IMPLANT
PAD MAGNETIC INST (MISCELLANEOUS) IMPLANT
PAD OB MATERNITY 4.3X12.25 (PERSONAL CARE ITEMS) ×4 IMPLANT
PAD PREP 24X48 CUFFED NSTRL (MISCELLANEOUS) ×8 IMPLANT
POSITIONER SURGICAL ARM (MISCELLANEOUS) ×8 IMPLANT
SCALPEL HARMONIC ACE (MISCELLANEOUS) IMPLANT
SET CYSTO W/LG BORE CLAMP LF (SET/KITS/TRAYS/PACK) ×8 IMPLANT
SET IRRIG TUBING LAPAROSCOPIC (IRRIGATION / IRRIGATOR) ×4 IMPLANT
SOLUTION ELECTROLUBE (MISCELLANEOUS) ×4 IMPLANT
SPONGE LAP 18X18 X RAY DECT (DISPOSABLE) IMPLANT
STAPLER VISISTAT 35W (STAPLE) IMPLANT
STRIP CLOSURE SKIN 1/2X4 (GAUZE/BANDAGES/DRESSINGS) ×4 IMPLANT
STRIP CLOSURE SKIN 1/4X3 (GAUZE/BANDAGES/DRESSINGS) IMPLANT
SUT CHROMIC 2 0 TIES 18 (SUTURE) IMPLANT
SUT MNCRL AB 3-0 PS2 27 (SUTURE) IMPLANT
SUT PDS AB 1 CT  36 (SUTURE)
SUT PDS AB 1 CT 36 (SUTURE) IMPLANT
SUT PLAIN 2 0 XLH (SUTURE) IMPLANT
SUT SILK 0 FSL (SUTURE) IMPLANT
SUT VIC AB 0 CT1 18XCR BRD8 (SUTURE) IMPLANT
SUT VIC AB 0 CT1 27 (SUTURE) ×6
SUT VIC AB 0 CT1 27XBRD ANBCTR (SUTURE) IMPLANT
SUT VIC AB 0 CT1 27XBRD ANTBC (SUTURE) ×18 IMPLANT
SUT VIC AB 0 CT1 8-18 (SUTURE)
SUT VIC AB 0 CT2 27 (SUTURE) IMPLANT
SUT VIC AB 2-0 CT1 (SUTURE) IMPLANT
SUT VIC AB 2-0 CT2 27 (SUTURE) ×4 IMPLANT
SUT VIC AB 2-0 SH 27 (SUTURE)
SUT VIC AB 2-0 SH 27XBRD (SUTURE) IMPLANT
SUT VIC AB 3-0 PS2 18 (SUTURE)
SUT VIC AB 3-0 PS2 18XBRD (SUTURE) IMPLANT
SUT VIC AB 3-0 SH 27 (SUTURE)
SUT VIC AB 3-0 SH 27X BRD (SUTURE) IMPLANT
SUT VICRYL 0 ENDOLOOP (SUTURE) IMPLANT
SUT VICRYL 0 TIES 12 18 (SUTURE) IMPLANT
SUT VICRYL 0 UR6 27IN ABS (SUTURE) ×8 IMPLANT
SUT VICRYL RAPIDE 3 0 (SUTURE) ×8 IMPLANT
SYR 20CC LL (SYRINGE) ×4 IMPLANT
SYR 50ML LL SCALE MARK (SYRINGE) ×4 IMPLANT
SYR CONTROL 10ML LL (SYRINGE) IMPLANT
SYR TB 1ML 25GX5/8 (SYRINGE) IMPLANT
SYR TB 1ML LUER SLIP (SYRINGE) IMPLANT
SYSTEM CONVERTIBLE TROCAR (TROCAR) ×4 IMPLANT
TIP UTERINE 5.1X6CM LAV DISP (MISCELLANEOUS) IMPLANT
TIP UTERINE 6.7X10CM GRN DISP (MISCELLANEOUS) IMPLANT
TIP UTERINE 6.7X6CM WHT DISP (MISCELLANEOUS) IMPLANT
TIP UTERINE 6.7X8CM BLUE DISP (MISCELLANEOUS) IMPLANT
TOWEL OR 17X24 6PK STRL BLUE (TOWEL DISPOSABLE) ×8 IMPLANT
TRAY FOLEY CATH 14FR (SET/KITS/TRAYS/PACK) ×4 IMPLANT
TROCAR 12M 150ML BLUNT (TROCAR) ×4 IMPLANT
TROCAR BALL TOP DISP 5MM (ENDOMECHANICALS) IMPLANT
TROCAR DISP BLADELESS 8 DVNC (TROCAR) ×3 IMPLANT
TROCAR DISP BLADELESS 8MM (TROCAR) ×1
TROCAR XCEL 12X100 BLDLESS (ENDOMECHANICALS) IMPLANT
TROCAR Z-THREAD BLADED 11X100M (TROCAR) ×4 IMPLANT
TROCAR Z-THREAD BLADED 12X100M (TROCAR) IMPLANT
TUBING FILTER THERMOFLATOR (ELECTROSURGICAL) ×8 IMPLANT
WARMER LAPAROSCOPE (MISCELLANEOUS) ×4 IMPLANT
WATER STERILE IRR 1000ML POUR (IV SOLUTION) ×12 IMPLANT

## 2010-12-13 NOTE — Brief Op Note (Signed)
12/13/2010  4:02 PM  PATIENT:  [name]  40 y.o. female  PRE-OPERATIVE DIAGNOSIS:  Menorrhagia; Dysmenorrhea  POST-OPERATIVE DIAGNOSIS:  Menorrhagia; Dysmenorrhea, Uterine Fibroids  PROCEDURE:  Procedure(s): ROBOTIC ASSISTED SUPRACERVICAL HYSTERECTOMY CYSTOSCOPY  SURGEON:  Surgeon(s): Hal Morales, MD Esmeralda Arthur, MD     ASSISTANTS: Marquis Lunch. Lowell Guitar, PA-C  ANESTHESIA:   general   EBL:  250 cc IV Fluids:  LR-1500 cc       Hexextend: 1000 cc Urine output: 150 cc measured  LOCAL MEDICATIONS USED:  MARCAINE   .25 % 20 cc   SPECIMEN:  Source of Specimen:  Uterine fundus  DISPOSITION OF SPECIMEN:  PATHOLOGY  COUNTS:  YES  DICTATION: .Other Dictation: Dictation Number [id number]    [place] J. Quincy Sheehan  Full Operative report dictated [phone]

## 2010-12-13 NOTE — Op Note (Signed)
[name], [name]                ACCOUNT NO.:  [id number]  MEDICAL RECORD NO.:  [id number]  LOCATION:  9312                          FACILITY:  [facility]  PHYSICIAN:  Hal Morales, M.D.DATE OF BIRTH:  Aug 08, 1970  DATE OF PROCEDURE:  12/13/2010 DATE OF DISCHARGE:                              OPERATIVE REPORT   PREOPERATIVE DIAGNOSES: 1. Menorrhagia, status post endometrial ablation. 2. Pelvic pain with dysmenorrhea. 3. Uterine fibroids 4. Status post 4 cesarean sections  POSTOPERATIVE DIAGNOSES: 1. Uterine fibroids and probable adenomyosis. 2. Significant bladder adherence to the anterior cervix.  PROCEDURES: 1. Robotically-assisted supracervical hysterectomy. 2. Repair of incidental cystotomy. 3. Cystoscopy.  SURGEON:  Hal Morales, MD  FIRST ASSISTANT:  Elmira J. Lowell Guitar, [place].  Proctor: Dr. Dois Davenport Rivard  ANESTHESIA:  General orotracheal.  ESTIMATED BLOOD LOSS:  250 mL.  COMPLICATIONS:  Incidental cystotomy associated with attempts to dissect the bladder off the anterior cervix.  SPECIMENS TO PATHOLOGY: Morcellated uterus.  PREOPERATIVE DISCUSSION:  A discussion had been held with the patient concerning her desire for hysterectomy as definitive therapy for her menorrhagia, which had previously been treated with endometrial ablation, but without resolution.  The patient also complained of significant dysmenorrhea.  She was given the options of other conservative therapies, but wished to proceed with hysterectomy.  The patient was known for 4 previous cesarean sections.  PROCEDURE IN DETAIL:  The patient was taken to the operating room after appropriate identification and placed on the operating table.  After the attainment of adequate general anesthesia, she was placed in the modified lithotomy position.  The abdomen was prepped with ChloraPrep and the perineum and vagina prepped with Betadine.  A 3-way Foley catheter was inserted into the bladder and  connected to straight drainage.  A RUMI uterine manipulator was then assembled with a Koh ring. The manipulator was placed into the endometrial cavity after this cervix was dilated, and the uterus sounded to 11 cm. The Koh ring was attached to the cervix with a suture of 0 Vicryl. The patient was then draped with the abdomen as a sterile field.  Supraumbilical placement of 0.25% Marcaine was undertaken.  A supraumbilical incision measuring approximately 12 mm was undertaken.  A Veress cannula was used to create a pneumoperitoneum with 4 L of CO2.  The Veress cannula was removed and a 12-mm trocar placed in the incision.  The camera was placed through that trocar and the above-noted findings made including the patient being status post tubal ligation and normal ovaries bilaterally.  The uterus was enlarged to approximately 10-week size and globular.  There were no stigmata of endometriosis.  There was significant adhesion on the anterior cervix status post cesarean sections.  Placement of 3 ports for robotic arms in an arcuate fashion was undertaken and assistant port placed on the right flank approximately 6 cm above the anterior superior iliac spine, all under direct visualization.  Each of these trocars except for the assistant port was an 8-mm trocar.  The assistant port contained a 12-mm trocar. Once all of the trocars had been placed, the [employer] robot was docked and the surgeon approached the operating console.   Using a  PK bipolar instrument and monopolar scissors, the right round ligament was cauterized and incised and that incision taken anteriorly on the anterior leaf of the broad ligament toward the bladder.  It was there that significant adhesions were encountered, and attention was then turned to the utero-ovarian ligament on the right side with a combination of cautery and cutting freeing the ovary from the uterus.  The posterior leaf of the broad ligament was incised down to  the level of the uterine artery.  A similar procedure was carried out on the opposite side with the round ligament and the utero- ovarian ligament, utilizing bipolar cautery for the achievement of adequate hemostasis.  Numerous attempts were made to dissect the bladder off the anterior cervix utilizing significant magnification from the 3D robotic camera as well as bladder distention with formula to outline the adherent bladder.  Never could an adequate plane be established between the anterior cervix and the bladder.  In an attempt to establish such a plane, a less than 5-mm defect was noted in the dome of the bladder. This was recognized then repaired with interrupted sutures of 3-0 Vicryl in 2 perpendicular layers.  The repair was tested and noted to be watertight.  At this point, the decision was made that further dissection of the bladder off the anterior cervix would likely lead to further bladder damage and the decision was made to proceed with a supracervical hysterectomy.  The uterine arteries on the right and left side were then skeletonized and cauterized at the junction of the cervix and fundus.  Once this had been achieved in a satisfactory fashion, the monopolar scissors were used to excise the fundus from the cervix.  This was done circumferentially down to the level of the RUMI uterine manipulator and the fundus placed in the posterior cul-de-sac for later retrieval.  The cut surface of the cervix was made hemostatic with bipolar and monopolar cautery, and the endocervical canal cauterized with both bipolar and monopolar cautery.  Copious irrigation was carried out, and hemostasis achieved in several areas with bipolar cautery.  At this point, the surgeon left the room to scrub and returned to undock the robot.  The 12-mm assistant port was then removed and replaced with the Storz morcellator.  This was used to morcellate the entire uterine fundus.  Once this was complete,  all trocars were removed from the peritoneal cavity under direct visualization.  The two 12-mm ports were closed with fascial sutures of 0 Vicryl and subcuticular sutures of 3-0 Vicryl.  All incisions were then covered with Dermabond.   The patient was given intravenous indigo carmine and the legs placed in lithotomy position in preparation for cystoscopy.  The cystoscope was used to visualize and fill the bladder with egress of blue urine documented from the right and left ureters respectively.  The site of cystotomy repair was likewise inspected and noted to be intact.  The cystoscope was removed and a Foley catheter placed into the bladder and then connected to straight drainage.  The patient was awakened from general anesthesia and taken to the recovery room in satisfactory condition having tolerated the procedure well with sponge and instrument counts correct.     Hal Morales, M.D.     VPH/MEDQ  D:  12/13/2010  T:  12/13/2010  Job:  [id number]

## 2010-12-13 NOTE — Progress Notes (Signed)
Day of Surgery Procedure(s) (LRB): ROBOTIC ASSISTED SUPRACERVICAL HYSTERECTOMY () CYSTOSCOPY ()  Subjective: Patient reports nausea and incisional pain.  Both are now improved after medication  Objective: I have reviewed patient's vital signs, intake and output and medications.  General: alert Abdomen soft incisions dry Assessment: s/p Procedure(s): ROBOTIC ASSISTED SUPRACERVICAL HYSTERECTOMY CYSTOSCOPY: stable  Plan: Advance diet Encourage ambulation Advance to PO medication Discharge home in am  LOS: 0 days    [name] P 12/13/2010, 10:17 PM

## 2010-12-13 NOTE — Anesthesia Postprocedure Evaluation (Signed)
Anesthesia Post Note  Patient: [name]  Procedure(s) Performed:  ROBOTIC ASSISTED SUPRACERVICAL HYSTERECTOMY; CYSTOSCOPY  Anesthesia type: General  Patient location: PACU  Post pain: Pain level controlled  Post assessment: Post-op Vital signs reviewed  Last Vitals:  Filed Vitals:   12/13/10 1730  BP: 133/93  Pulse: 93  Temp:   Resp: 19    Post vital signs: Reviewed  Level of consciousness: sedated  Complications: No apparent anesthesia complications. The arterial line will be removed in the PACU and the patient will go to the floor.

## 2010-12-13 NOTE — Progress Notes (Signed)
Patient admitted from OR.  Sedated with periods of apnea noted requiring moderate stimulation to breathe.  O2 sats decreased to 75 on 6L via nasal cannula.  Patient placed on simple mask at 10L.

## 2010-12-13 NOTE — Transfer of Care (Signed)
Immediate Anesthesia Transfer of Care Note  Patient: [name]  Procedure(s) Performed:  ROBOTIC ASSISTED SUPRACERVICAL HYSTERECTOMY; CYSTOSCOPY  Patient Location: PACU  Anesthesia Type: General  Level of Consciousness: oriented and sedated  Airway & Oxygen Therapy: Patient Spontanous Breathing and Patient connected to nasal cannula oxygen  Post-op Assessment: Report given to PACU RN and Post -op Vital signs reviewed and stable  Post vital signs: stable  Complications: No apparent anesthesia complications

## 2010-12-13 NOTE — Anesthesia Preprocedure Evaluation (Addendum)
Anesthesia Evaluation  Patient identified by MRN, date of birth, ID band Patient awake    Reviewed: Allergy & Precautions, H&P , Patient's Chart, lab work & pertinent test results, reviewed documented beta blocker date and time   Airway Mallampati: II TM Distance: >3 FB Neck ROM: full  Mouth opening: Limited Mouth Opening Comment: Slightly limited opening; Pertinent with thinned/chipped front teeth Dental No notable dental hx. (+)    Pulmonary  clear to auscultation  Pulmonary exam normal       Cardiovascular regular Normal    Neuro/Psych    GI/Hepatic   Endo/Other    Renal/GU      Musculoskeletal   Abdominal   Peds  Hematology   Anesthesia Other Findings   Reproductive/Obstetrics                          Anesthesia Physical Anesthesia Plan  ASA: I  Anesthesia Plan: General   Post-op Pain Management:    Induction: Intravenous  Airway Management Planned: Oral ETT  Additional Equipment:   Intra-op Plan:   Post-operative Plan:   Informed Consent: I have reviewed the patients History and Physical, chart, labs and discussed the procedure including the risks, benefits and alternatives for the proposed anesthesia with the patient or authorized representative who has indicated his/her understanding and acceptance.   Dental Advisory Given  Plan Discussed with: CRNA and Surgeon  Anesthesia Plan Comments: (  Discussed  general anesthesia, including possible nausea, instrumentation of airway, sore throat,pulmonary aspiration, etc. I asked if the were any outstanding questions, or  concerns before we proceeded. )        Anesthesia Quick Evaluation

## 2010-12-13 NOTE — H&P (Signed)
I have examined the patient and reviewed the history.  There are no substantive changes.  I have reviewed the risks, benefits, alternative treatments, and the patient wishes to proceed.

## 2010-12-14 ENCOUNTER — Encounter (HOSPITAL_COMMUNITY): Payer: Self-pay | Admitting: Obstetrics and Gynecology

## 2010-12-14 LAB — CBC
HCT: 28.6 % — ABNORMAL LOW (ref 36.0–46.0)
Hemoglobin: 9 g/dL — ABNORMAL LOW (ref 12.0–15.0)
MCH: 29.8 pg (ref 26.0–34.0)
MCHC: 31.5 g/dL (ref 30.0–36.0)
MCV: 94.7 fL (ref 78.0–100.0)
Platelets: 146 10*3/uL — ABNORMAL LOW (ref 150–400)
RBC: 3.02 MIL/uL — ABNORMAL LOW (ref 3.87–5.11)
RDW: 14.6 % (ref 11.5–15.5)
WBC: 9 10*3/uL (ref 4.0–10.5)

## 2010-12-14 MED ORDER — DSS 100 MG PO CAPS: 100.0000 mg | ORAL_CAPSULE | Freq: Three times a day (TID) | ORAL | Status: AC | PRN

## 2010-12-14 MED ORDER — PROMETHAZINE HCL 12.5 MG PO TABS: 12.5000 mg | ORAL_TABLET | Freq: Four times a day (QID) | ORAL | Status: AC | PRN

## 2010-12-14 MED ORDER — NITROFURANTOIN MONOHYD MACRO 100 MG PO CAPS: 100.0000 mg | ORAL_CAPSULE | Freq: Two times a day (BID) | ORAL | Status: AC

## 2010-12-14 MED ORDER — FERROUS SULFATE 325 (65 FE) MG PO TBEC: 325.0000 mg | DELAYED_RELEASE_TABLET | Freq: Two times a day (BID) | ORAL | Status: DC

## 2010-12-14 MED ORDER — IBUPROFEN 600 MG PO TABS: 600.0000 mg | ORAL_TABLET | Freq: Four times a day (QID) | ORAL | Status: AC | PRN

## 2010-12-14 MED ORDER — OXYCODONE-ACETAMINOPHEN 5-325 MG PO TABS: 1.0000 | ORAL_TABLET | ORAL | Status: AC | PRN

## 2010-12-14 NOTE — Anesthesia Postprocedure Evaluation (Signed)
  Anesthesia Post-op Note  Patient: [name]  Procedure(s) Performed:  ROBOTIC ASSISTED SUPRACERVICAL HYSTERECTOMY; CYSTOSCOPY  Patient Location: [facility]  Anesthesia Type: General  Level of Consciousness: awake  Airway and Oxygen Therapy: Patient Spontanous Breathing  Post-op Pain: mild  Post-op Assessment: Post-op Vital signs reviewed  Post-op Vital Signs: Reviewed and stable  Complications: No apparent anesthesia complications

## 2010-12-14 NOTE — Transfer of Care (Signed)
Immediate Anesthesia Transfer of Care Note  Patient: [name]  Procedure(s) Performed:  ROBOTIC ASSISTED SUPRACERVICAL HYSTERECTOMY; CYSTOSCOPY  Patient Location: PACU  Anesthesia Type: General  Level of Consciousness: awake  Airway & Oxygen Therapy: Patient Spontanous Breathing  Post-op Assessment: Report given to PACU RN  Post vital signs: Reviewed and stable  Complications: No apparent anesthesia complications

## 2010-12-14 NOTE — Discharge Summary (Signed)
  Physician Discharge Summary  Patient ID: [name] MRN: [id number] DOB/AGE: August 06, 1970 40 y.o.  Admit date: 12/13/2010 Discharge date: 12/14/2010  Admission Diagnoses: Menorrhagia, Dysmenorrhea  Discharge Diagnoses: Menorrhagia, Dysmenorrhea        Active Problems:  Menorrhagia  Fibroid, uterine  Dysmenorrhea Bladder Injury Anemia  Discharged Condition: stable  Hospital Course: On date of admission, the patient underwent a robot assisted laparoscopic supracervical hysterectomy, uterine morcellation, lysis of adhesions, bladder repair and cystoscopy, tolerating all procedures well.  By post operative day #1 patient was tolerating a regular diet, a Hgb. of 9, ambulating well and had adequate analgesia.  Consequently, patient was deemed ready for discharge home with a Foley leg bag and prophylactic antibiotics to be removed in seven days.   Disposition:  Home  Discharge Orders    Future Orders Please Complete By Expires   Urinary leg bag      Comments:   Keep leg bag in place until removed at [facility] in 7 days.  Call for an appointment to have this removed.     Current Discharge Medication List    START taking these medications   Details  docusate sodium 100 MG CAPS Take 100 mg by mouth 3 (three) times daily as needed for constipation (Take stool softeners as directed while taking iron and pain medications to keep bowel movements regular.). Qty: 100 capsule    ferrous sulfate 325 (65 FE) MG EC tablet Take 1 tablet (325 mg total) by mouth 2 (two) times daily. Qty: 60 tablet, Refills: 3    nitrofurantoin, macrocrystal-monohydrate, (MACROBID) 100 MG capsule Take 1 capsule (100 mg total) by mouth 2 (two) times daily. Qty: 14 capsule, Refills: 0    oxyCODONE-acetaminophen (PERCOCET) 5-325 MG per tablet Take 1-2 tablets by mouth every 4 (four) hours as needed for pain (moderate to severe pain ). Qty: 30 tablet, Refills: 0    promethazine (PHENERGAN) 12.5 MG  tablet Take 1 tablet (12.5 mg total) by mouth every 6 (six) hours as needed for nausea. Qty: 30 tablet, Refills: 0      CONTINUE these medications which have CHANGED   Details  ibuprofen (ADVIL,MOTRIN) 600 MG tablet Take 1 tablet (600 mg total) by mouth every 6 (six) hours as needed for pain (give with food starting 6 hours after last Toradol dose. Take as directed for the next 5 days then as needed for pain.). Qty: 30 tablet, Refills: 1       Follow-up Information    Make an appointment with Hal Morales, MD. (Call for appointment to remove leg bag in 7 days and  to schedule post operative appointment in two weeks. as needed as needed as needed)    Contact information:   [address] [place] [address] [phone]          Signed: Patrick Jupiter 12/14/2010, 8:01 AM

## 2010-12-14 NOTE — Progress Notes (Cosign Needed)
1 Day Post-Op Procedure(s) (LRB): ROBOTIC ASSISTED SUPRACERVICAL HYSTERECTOMY () CYSTOSCOPY ()  Subjective: Patient reports that pain is well managed.  Tolerating clear liquids   without difficulty. No  further nausea, analgesia adequate and ambulating.  Hasn't passed flatus yet.  Objective: BP 112/71  Pulse 75  Temp(Src) 98 F (36.7 C) (Oral)  Resp 16  Ht 5\' 6"  (1.676 m)  Wt 73.029 kg (161 lb)  BMI 25.99 kg/m2  SpO2 98% Lungs: clear Heart: normal rate and rhythm Abdomen:soft and appropriately tender, incisions intact though left upper incision with dressing containing faint pink stain, dressing is dry and intact however. Extremities: Homans sign is negative, no sign of DVT  Foley bag with 150 cc clear urine  Assessment: s/p : ROBOTIC ASSISTED SUPRACERVICAL HYSTERECTOMY Cystotomy & CYSTOSCOPY: stable and anemia;   Plan: Advance diet Discontinue IV fluids Discharge home with Foley leg bag  LOS: 1 day    Marsean Elkhatib, PA-C 12/14/2010, 7:27 AM

## 2010-12-14 NOTE — Addendum Note (Signed)
Addendum  created 12/14/10 0936 by Cephus Shelling   Modules edited:Notes Section

## 2015-01-06 ENCOUNTER — Emergency Department (HOSPITAL_COMMUNITY)
Admission: EM | Admit: 2015-01-06 | Discharge: 2015-01-06 | Disposition: A | Discharge status: Home / Self Care | Payer: Managed Care, Other (non HMO) | Attending: Emergency Medicine | Admitting: Emergency Medicine

## 2015-01-06 ENCOUNTER — Encounter (HOSPITAL_COMMUNITY): Payer: Self-pay | Admitting: Emergency Medicine

## 2015-01-06 DIAGNOSIS — Z8659 Personal history of other mental and behavioral disorders: Secondary | ICD-10-CM | POA: Insufficient documentation

## 2015-01-06 DIAGNOSIS — S0591XA Unspecified injury of right eye and orbit, initial encounter: Secondary | ICD-10-CM | POA: Diagnosis present

## 2015-01-06 DIAGNOSIS — Y998 Other external cause status: Secondary | ICD-10-CM | POA: Insufficient documentation

## 2015-01-06 DIAGNOSIS — Y9389 Activity, other specified: Secondary | ICD-10-CM | POA: Diagnosis not present

## 2015-01-06 DIAGNOSIS — Y92410 Unspecified street and highway as the place of occurrence of the external cause: Secondary | ICD-10-CM | POA: Insufficient documentation

## 2015-01-06 MED ORDER — FLUORESCEIN SODIUM 1 MG OP STRP
1.0000 | ORAL_STRIP | Freq: Once | OPHTHALMIC | Status: AC
  Administered 2015-01-06: 1 via OPHTHALMIC
  Filled 2015-01-06: qty 1

## 2015-01-06 MED ORDER — TETRACAINE HCL 0.5 % OP SOLN
1.0000 [drp] | Freq: Once | OPHTHALMIC | Status: AC
  Administered 2015-01-06: 1 [drp] via OPHTHALMIC
  Filled 2015-01-06: qty 2
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
## 2015-01-06 NOTE — Discharge Instructions (Signed)
Follow-up with Dr. Talbert Forest tomorrow at 8 AM

## 2018-06-14 ENCOUNTER — Emergency Department (HOSPITAL_COMMUNITY)
Admission: EM | Admit: 2018-06-14 | Discharge: 2018-06-14 | Disposition: A | Discharge status: Home / Self Care | Payer: Medicaid Other | Attending: Emergency Medicine | Admitting: Emergency Medicine

## 2018-06-14 ENCOUNTER — Encounter (HOSPITAL_COMMUNITY): Payer: Self-pay | Admitting: Emergency Medicine

## 2018-06-14 ENCOUNTER — Emergency Department (HOSPITAL_COMMUNITY): Payer: Medicaid Other

## 2018-06-14 ENCOUNTER — Other Ambulatory Visit: Payer: Self-pay

## 2018-06-14 DIAGNOSIS — K8020 Calculus of gallbladder without cholecystitis without obstruction: Secondary | ICD-10-CM | POA: Diagnosis not present

## 2018-06-14 DIAGNOSIS — K8500 Idiopathic acute pancreatitis without necrosis or infection: Secondary | ICD-10-CM | POA: Diagnosis not present

## 2018-06-14 DIAGNOSIS — R1032 Left lower quadrant pain: Secondary | ICD-10-CM | POA: Diagnosis present

## 2018-06-14 DIAGNOSIS — E860 Dehydration: Secondary | ICD-10-CM | POA: Insufficient documentation

## 2018-06-14 LAB — URINALYSIS, ROUTINE W REFLEX MICROSCOPIC
Bilirubin Urine: NEGATIVE
Glucose, UA: NEGATIVE mg/dL
Hgb urine dipstick: NEGATIVE
Ketones, ur: 80 mg/dL — AB
Leukocytes,Ua: NEGATIVE
Nitrite: NEGATIVE
Protein, ur: 30 mg/dL — AB
Specific Gravity, Urine: 1.025 (ref 1.005–1.030)
pH: 7 (ref 5.0–8.0)

## 2018-06-14 LAB — I-STAT BETA HCG BLOOD, ED (MC, WL, AP ONLY): I-stat hCG, quantitative: <5 m[IU]/mL (ref <5)

## 2018-06-14 LAB — CBC
HCT: 46.4 % — ABNORMAL HIGH (ref 36.0–46.0)
Hemoglobin: 15.5 g/dL — ABNORMAL HIGH (ref 12.0–15.0)
MCH: 32 pg (ref 26.0–34.0)
MCHC: 33.4 g/dL (ref 30.0–36.0)
MCV: 95.7 fL (ref 80.0–100.0)
Platelets: 207 10*3/uL (ref 150–400)
RBC: 4.85 MIL/uL (ref 3.87–5.11)
RDW: 12.8 % (ref 11.5–15.5)
WBC: 11.8 10*3/uL — ABNORMAL HIGH (ref 4.0–10.5)
nRBC: 0 % (ref 0.0–0.2)

## 2018-06-14 LAB — COMPREHENSIVE METABOLIC PANEL
ALT: 21 U/L (ref 0–44)
AST: 20 U/L (ref 15–41)
Albumin: 4.1 g/dL (ref 3.5–5.0)
Alkaline Phosphatase: 51 U/L (ref 38–126)
Anion gap: 12 (ref 5–15)
BUN: 9 mg/dL (ref 6–20)
CO2: 24 mmol/L (ref 22–32)
Calcium: 10.4 mg/dL — ABNORMAL HIGH (ref 8.9–10.3)
Chloride: 100 mmol/L (ref 98–111)
Creatinine, Ser: 0.73 mg/dL (ref 0.44–1.00)
GFR calc Af Amer: >60 mL/min (ref >60)
GFR calc non Af Amer: >60 mL/min (ref >60)
Glucose, Bld: 123 mg/dL — ABNORMAL HIGH (ref 70–99)
Potassium: 4 mmol/L (ref 3.5–5.1)
Sodium: 136 mmol/L (ref 135–145)
Total Bilirubin: 0.9 mg/dL (ref 0.3–1.2)
Total Protein: 7.6 g/dL (ref 6.5–8.1)

## 2018-06-14 LAB — LIPASE, BLOOD: Lipase: 116 U/L — ABNORMAL HIGH (ref 11–51)

## 2018-06-14 MED ORDER — SODIUM CHLORIDE 0.9% FLUSH
3.0000 mL | Freq: Once | INTRAVENOUS | Status: AC
  Administered 2018-06-14: 02:00:00 3 mL via INTRAVENOUS

## 2018-06-14 MED ORDER — MORPHINE SULFATE (PF) 4 MG/ML IV SOLN
4.0000 mg | Freq: Once | INTRAVENOUS | Status: AC
  Administered 2018-06-14: 4 mg via INTRAVENOUS
  Filled 2018-06-14: qty 1

## 2018-06-14 MED ORDER — SODIUM CHLORIDE 0.9 % IV BOLUS (SEPSIS)
1000.0000 mL | Freq: Once | INTRAVENOUS | Status: AC
  Administered 2018-06-14: 1000 mL via INTRAVENOUS

## 2018-06-14 MED ORDER — MORPHINE SULFATE (PF) 4 MG/ML IV SOLN
4.0000 mg | Freq: Once | INTRAVENOUS | Status: AC
  Administered 2018-06-14: 02:00:00 4 mg via INTRAVENOUS
  Filled 2018-06-14: qty 1

## 2018-06-14 MED ORDER — KETOROLAC TROMETHAMINE 30 MG/ML IJ SOLN
30.0000 mg | Freq: Once | INTRAMUSCULAR | Status: AC
  Administered 2018-06-14: 02:00:00 30 mg via INTRAVENOUS
  Filled 2018-06-14: qty 1

## 2018-06-14 MED ORDER — ONDANSETRON 4 MG PO TBDP: 4.0000 mg | ORAL_TABLET | Freq: Four times a day (QID) | ORAL | 0 refills | Status: DC | PRN

## 2018-06-14 MED ORDER — OXYCODONE-ACETAMINOPHEN 5-325 MG PO TABS: 2.0000 | ORAL_TABLET | Freq: Four times a day (QID) | ORAL | 0 refills | Status: DC | PRN

## 2018-06-14 MED ORDER — ONDANSETRON HCL 4 MG/2ML IJ SOLN
4.0000 mg | Freq: Once | INTRAMUSCULAR | Status: AC
  Administered 2018-06-14: 03:00:00 4 mg via INTRAVENOUS
  Filled 2018-06-14: qty 2

## 2018-06-14 MED ORDER — SODIUM CHLORIDE 0.9 % IV BOLUS (SEPSIS)
1000.0000 mL | Freq: Once | INTRAVENOUS | Status: AC
  Administered 2018-06-14: 02:00:00 1000 mL via INTRAVENOUS

## 2018-06-14 NOTE — ED Notes (Signed)
Patient transported to CT 

## 2018-06-14 NOTE — ED Provider Notes (Signed)
TIME SEEN: 1:49 AM  CHIEF COMPLAINT: Left flank pain  HPI: Patient is a 48 year old female with history of depression who presents to the emergency department with left flank pain that radiates into the left lower quadrant.  Described as sharp and severe in nature, intermittent.  She has had nausea and vomiting.  No fevers, chills, chest pain, shortness of breath, cough, diarrhea, dysuria, hematuria, vaginal bleeding or discharge.  She has had previous C-sections.  No history of kidney stone.  Denies injury to the back.  No numbness, tingling or focal weakness.  Able to ambulate.  No bowel or bladder incontinence.  ROS: See HPI Constitutional: no fever  Eyes: no drainage  ENT: no runny nose   Cardiovascular:  no chest pain  Resp: no SOB  GI:  vomiting GU: no dysuria Integumentary: no rash  Allergy: no hives  Musculoskeletal: no leg swelling  Neurological: no slurred speech ROS otherwise negative  PAST MEDICAL HISTORY/PAST SURGICAL HISTORY:  Past Medical History:  Diagnosis Date  . Depression    history - no current prob - no meds    MEDICATIONS:  Prior to Admission medications   Medication Sig Start Date End Date Taking? Authorizing Provider  ferrous sulfate 325 (65 FE) MG EC tablet Take 1 tablet (325 mg total) by mouth 2 (two) times daily. 12/14/10 12/14/11  Earnstine Regal, PA-C    ALLERGIES:  No Known Allergies  SOCIAL HISTORY:  Social History   Tobacco Use  . Smoking status: Never Smoker  . Smokeless tobacco: Never Used  Substance Use Topics  . Alcohol use: Yes    Comment: socially    FAMILY HISTORY: No family history on file.  EXAM: BP (!) 139/93 (BP Location: Right Arm)   Pulse 97   Temp 99.7 F (37.6 C) (Oral)   Resp 19   Ht 5\' 6"  (1.676 m)   Wt 68 kg   SpO2 100%   BMI 24.21 kg/m  CONSTITUTIONAL: Alert and oriented and responds appropriately to questions.  Appears uncomfortable but not in distress, nontoxic-appearing HEAD: Normocephalic EYES:  Conjunctivae clear, pupils appear equal, EOMI ENT: normal nose; moist mucous membranes NECK: Supple, no meningismus, no nuchal rigidity, no LAD  CARD: RRR; S1 and S2 appreciated; no murmurs, no clicks, no rubs, no gallops RESP: Normal chest excursion without splinting or tachypnea; breath sounds clear and equal bilaterally; no wheezes, no rhonchi, no rales, no hypoxia or respiratory distress, speaking full sentences ABD/GI: Normal bowel sounds; non-distended; soft, no to palpation in the left lower quadrant without guarding or rebound, no peritoneal signs BACK:  The back appears normal Spinal tenderness or step-off or deformity, patient has some mild left CVA tenderness EXT: Normal ROM in all joints; non-tender to palpation; no edema; normal capillary refill; no cyanosis, no calf tenderness or swelling    SKIN: Normal color for age and race; warm; no rash NEURO: Moves all extremities equally, normal sensation diffusely, no saddle anesthesia PSYCH: The patient's mood and manner are appropriate. Grooming and personal hygiene are appropriate.  MEDICAL DECISION MAKING: Patient here with left flank pain radiating into the left lower quadrant.  Differential includes pyelonephritis, kidney stone, diverticulitis, colitis.  Doubt appendicitis, cholecystitis.  Will obtain labs, urine and CT scan without contrast.  Will give IV fluids, Toradol, morphine, Zofran for symptomatic relief.  ED PROGRESS: Urine shows large ketones but no blood or sign of infection.  Will give 2 L of IV fluids.  Her lab work appears hemoconcentrated.  Pregnancy test is  negative.  Patient's lipase is elevated at 116.  Normal LFTs.  She did not have any right upper quadrant abdominal tenderness and no significant epigastric pain.  Pancreatitis is on the differential.  CT scan is pending.  4:20 AM  Pt's CT scan shows acute pancreatitis involving the pancreatic tail and left anterior pararenal space which explains her left flank pain.   She has a resolving pseudocyst.  She has cholelithiasis is no sign of cholecystitis.  She had a negative Murphy sign on exam.   She denies history of heavy alcohol use.  No known history of diabetes or high triglycerides.  Pain has improved after morphine and Toradol.  Nausea has resolved.  She is able to drink here.  She has been given 2 L of IV fluids for hydration.  I have offered her admission for pain control and hydration which she declines.  She would like to try pain medication at home with nausea medicine and a liquid diet.  I feel this is reasonable. Discussed return precautions with patient.  She is comfortable with this plan.   At this time, I do not feel there is any life-threatening condition present. I have reviewed and discussed all results (EKG, imaging, lab, urine as appropriate) and exam findings with patient/family. I have reviewed nursing notes and appropriate previous records.  I feel the patient is safe to be discharged home without further emergent workup and can continue workup as an outpatient as needed. Discussed usual and customary return precautions. Patient/family verbalize understanding and are comfortable with this plan.  Outpatient follow-up has been provided as needed. All questions have been answered.    Amal Saiki, Delice Bison, DO 06/14/18 [phone]

## 2018-06-14 NOTE — ED Triage Notes (Signed)
Reports left flank pain that started yesterday.  Recently had dental procedure.  Reports the pain got worse after taking the steroids she was prescribed.

## 2018-06-14 NOTE — Discharge Instructions (Addendum)
I recommend a liquid diet for the next 3 days and then you may slowly progress your diet.  Please avoid spicy, greasy, fatty meals.  Please avoid alcohol.

## 2018-06-14 NOTE — ED Notes (Signed)
Discharge instructions dicussed with pt. All questions answer. Pt verbalized understanding. Pt to go home with friend

## 2018-06-20 ENCOUNTER — Emergency Department (HOSPITAL_COMMUNITY)
Admission: EM | Admit: 2018-06-20 | Discharge: 2018-06-20 | Disposition: A | Discharge status: Home / Self Care | Payer: Medicaid Other | Attending: Emergency Medicine | Admitting: Emergency Medicine

## 2018-06-20 ENCOUNTER — Other Ambulatory Visit: Payer: Self-pay

## 2018-06-20 ENCOUNTER — Emergency Department (HOSPITAL_COMMUNITY): Payer: Medicaid Other

## 2018-06-20 DIAGNOSIS — R1012 Left upper quadrant pain: Secondary | ICD-10-CM

## 2018-06-20 DIAGNOSIS — R0789 Other chest pain: Secondary | ICD-10-CM | POA: Insufficient documentation

## 2018-06-20 LAB — URINALYSIS, ROUTINE W REFLEX MICROSCOPIC
Bacteria, UA: NONE SEEN
Bilirubin Urine: NEGATIVE
Glucose, UA: NEGATIVE mg/dL
Hgb urine dipstick: NEGATIVE
Ketones, ur: NEGATIVE mg/dL
Leukocytes,Ua: NEGATIVE
Nitrite: NEGATIVE
Protein, ur: 100 mg/dL — AB
Specific Gravity, Urine: 1.027 (ref 1.005–1.030)
pH: 6 (ref 5.0–8.0)

## 2018-06-20 LAB — COMPREHENSIVE METABOLIC PANEL
ALT: 29 U/L (ref 0–44)
AST: 23 U/L (ref 15–41)
Albumin: 3.4 g/dL — ABNORMAL LOW (ref 3.5–5.0)
Alkaline Phosphatase: 79 U/L (ref 38–126)
Anion gap: 10 (ref 5–15)
BUN: 11 mg/dL (ref 6–20)
CO2: 26 mmol/L (ref 22–32)
Calcium: 10.6 mg/dL — ABNORMAL HIGH (ref 8.9–10.3)
Chloride: 102 mmol/L (ref 98–111)
Creatinine, Ser: 0.7 mg/dL (ref 0.44–1.00)
GFR calc Af Amer: >60 mL/min (ref >60)
GFR calc non Af Amer: >60 mL/min (ref >60)
Glucose, Bld: 118 mg/dL — ABNORMAL HIGH (ref 70–99)
Potassium: 3.1 mmol/L — ABNORMAL LOW (ref 3.5–5.1)
Sodium: 138 mmol/L (ref 135–145)
Total Bilirubin: 0.6 mg/dL (ref 0.3–1.2)
Total Protein: 8 g/dL (ref 6.5–8.1)

## 2018-06-20 LAB — CBC
HCT: 43.2 % (ref 36.0–46.0)
Hemoglobin: 14.5 g/dL (ref 12.0–15.0)
MCH: 31.9 pg (ref 26.0–34.0)
MCHC: 33.6 g/dL (ref 30.0–36.0)
MCV: 95.2 fL (ref 80.0–100.0)
Platelets: 338 10*3/uL (ref 150–400)
RBC: 4.54 MIL/uL (ref 3.87–5.11)
RDW: 12.3 % (ref 11.5–15.5)
WBC: 15.9 10*3/uL — ABNORMAL HIGH (ref 4.0–10.5)
nRBC: 0 % (ref 0.0–0.2)

## 2018-06-20 LAB — LIPASE, BLOOD: Lipase: 49 U/L (ref 11–51)

## 2018-06-20 LAB — I-STAT BETA HCG BLOOD, ED (MC, WL, AP ONLY): I-stat hCG, quantitative: <5 m[IU]/mL (ref <5)

## 2018-06-20 MED ORDER — IOHEXOL 300 MG/ML  SOLN
100.0000 mL | Freq: Once | INTRAMUSCULAR | Status: AC | PRN
  Administered 2018-06-20: 100 mL via INTRAVENOUS

## 2018-06-20 NOTE — ED Provider Notes (Signed)
[facility] EMERGENCY DEPARTMENT Provider Note   CSN: [id number] Arrival date & time: 06/20/18  0017    History   Chief Complaint Chief Complaint  Patient presents with  . Flank Pain    HPI [name] is a 48 y.o. female.     Patient presents to the emergency department with a chief complaint of left-sided flank and abdominal pain.  She reports recently being diagnosed with pancreatitis.  She states that she feels improved with regard to the pain, but still does not feel like herself.  She denies any nausea, vomiting, or diarrhea.  Denies any fever, chills, or cough.  Denies having taken anything for her symptoms.  The history is provided by the patient. No language interpreter was used.    Past Medical History:  Diagnosis Date  . Depression    history - no current prob - no meds    Patient Active Problem List   Diagnosis Date Noted  . Menorrhagia 12/13/2010  . Fibroid, uterine 12/13/2010  . Dysmenorrhea 12/13/2010    Past Surgical History:  Procedure Laterality Date  . CESAREAN SECTION     x 4  . CYSTOSCOPY  12/13/2010   Procedure: CYSTOSCOPY;  Surgeon: Eldred Manges, MD;  Location: [facility];  Service: Gynecology;;  . EYE MUSCLE SURGERY     as child - right eye  . TUBAL LIGATION       OB History   No obstetric history on file.      Home Medications    Prior to Admission medications   Medication Sig Start Date End Date Taking? Authorizing Provider  amoxicillin (AMOXIL) 500 MG capsule Take 500 mg by mouth 4 (four) times daily.   Yes [provider]  ketorolac (TORADOL) 10 MG tablet Take 10 mg by mouth every 6 (six) hours as needed for moderate pain.   Yes [provider]  ondansetron (ZOFRAN ODT) 4 MG disintegrating tablet Take 1 tablet (4 mg total) by mouth every 6 (six) hours as needed. Patient taking differently: Take 4 mg by mouth every 6 (six) hours as needed for nausea or vomiting.  06/14/18  Yes Ward, Delice Bison, DO  oxyCODONE-acetaminophen (PERCOCET/ROXICET) 5-325 MG tablet Take 2 tablets by mouth every 6 (six) hours as needed. Patient taking differently: Take 2 tablets by mouth every 6 (six) hours as needed for moderate pain.  06/14/18  Yes Ward, Kristen N, DO  ferrous sulfate 325 (65 FE) MG EC tablet Take 1 tablet (325 mg total) by mouth 2 (two) times daily. Patient not taking: Reported on 06/20/2018 12/14/10 12/14/11  Earnstine Regal, PA-C    Family History No family history on file.  Social History Social History   Tobacco Use  . Smoking status: Never Smoker  . Smokeless tobacco: Never Used  Substance Use Topics  . Alcohol use: Yes    Comment: socially  . Drug use: No     Allergies   Patient has no known allergies.   Review of Systems Review of Systems  All other systems reviewed and are negative.    Physical Exam Updated Vital Signs BP (!) 131/94 (BP Location: Right Arm)   Pulse 94   Temp 99.5 F (37.5 C) (Oral)   Ht 5\' 6"  (1.676 m)   Wt 68 kg   SpO2 100%   BMI 24.20 kg/m   Physical Exam Vitals signs and nursing note reviewed.  Constitutional:      General: She is not in acute  distress.    Appearance: She is well-developed.  HENT:     Head: Normocephalic and atraumatic.  Eyes:     Conjunctiva/sclera: Conjunctivae normal.  Neck:     Musculoskeletal: Neck supple.  Cardiovascular:     Rate and Rhythm: Normal rate and regular rhythm.     Heart sounds: No murmur.  Pulmonary:     Effort: Pulmonary effort is normal. No respiratory distress.     Breath sounds: Normal breath sounds.     Comments: Left inferolateral rib tenderness Chest:     Chest wall: Tenderness present.  Abdominal:     Palpations: Abdomen is soft.     Tenderness: There is no abdominal tenderness.  Musculoskeletal: Normal range of motion.  Skin:    General: Skin is warm and dry.  Neurological:     Mental Status: She is alert and oriented to person, place, and time.  Psychiatric:         Mood and Affect: Mood normal.        Behavior: Behavior normal.        Thought Content: Thought content normal.        Judgment: Judgment normal.      ED Treatments / Results  Labs (all labs ordered are listed, but only abnormal results are displayed) Labs Reviewed  COMPREHENSIVE METABOLIC PANEL - Abnormal; Notable for the following components:      Result Value   Potassium 3.1 (*)    Glucose, Bld 118 (*)    Calcium 10.6 (*)    Albumin 3.4 (*)    All other components within normal limits  CBC - Abnormal; Notable for the following components:   WBC 15.9 (*)    All other components within normal limits  URINALYSIS, ROUTINE W REFLEX MICROSCOPIC - Abnormal; Notable for the following components:   APPearance HAZY (*)    Protein, ur 100 (*)    All other components within normal limits  LIPASE, BLOOD  POC URINE PREG, ED  I-STAT BETA HCG BLOOD, ED ([facility], [facility], [facility] ONLY)    EKG None  Radiology No results found.  Procedures Procedures (including critical care time)  Medications Ordered in ED Medications - No data to display   Initial Impression / Assessment and Plan / ED Course  I have reviewed the triage vital signs and the nursing notes.  Pertinent labs & imaging results that were available during my care of the patient were reviewed by me and considered in my medical decision making (see chart for details).        Patient with left upper abdominal pain.  Seen recently and diagnosed with pancreatitis.  She states that her pain is significantly improved, but she still feels rundown and worn out.  She denies any nausea, vomiting, or diarrhea.  Denies any fever.  White blood cell count is increased from prior visit.  Given persistent symptoms of not feeling well, will repeat CT.  CT still shows some inflammatory changes in the left upper abdomen.  I reviewed the CT with Dr. Christy Gentles.  Patient advised to follow-up with primary care.  She was again offered admission, but again  declined.  She looks well, and states that her pain is much better than on her prior visit a few days ago.  I feel that she will do fine, and can follow-up on an outpatient basis.  Return precautions discussed.  Final Clinical Impressions(s) / ED Diagnoses   Final diagnoses:  Left upper quadrant pain    ED  Discharge Orders    None       Montine Circle, PA-C 06/20/18 8718    Ripley Fraise, MD 06/20/18 [phone]

## 2018-06-20 NOTE — Discharge Instructions (Signed)
Your CT scan shows inflammatory changes in the left upper abdomen which are likely stemming from your recently diagnosed pancreatitis.  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  Please discuss this with your doctor.  You may benefit from additional imaging.  Return to the emergency department if your symptoms worsen.  Please drink plenty of water, avoid alcohol, and stick with easy to digest foods for the next week or so.

## 2018-06-20 NOTE — ED Triage Notes (Signed)
C/o on and off pain since last Friday. Along w/ feeling tired and weak. Pt stated "just want to feel like myself again". Pt reported was dx w/ Pancreatitis when seen last Friday. Denies N/V/D

## 2019-09-11 ENCOUNTER — Other Ambulatory Visit: Payer: Self-pay

## 2019-09-11 ENCOUNTER — Other Ambulatory Visit: Payer: Medicaid Other

## 2019-09-11 DIAGNOSIS — Z20822 Contact with and (suspected) exposure to covid-19: Secondary | ICD-10-CM

## 2019-09-13 ENCOUNTER — Telehealth: Payer: Self-pay

## 2019-09-13 LAB — NOVEL CORONAVIRUS, NAA: SARS-CoV-2, NAA: DETECTED — AB

## 2019-09-13 LAB — SARS-COV-2, NAA 2 DAY TAT

## 2019-09-13 NOTE — Telephone Encounter (Signed)
Pt called for covid results- advised pt that her results are not back.

## 2019-09-14 ENCOUNTER — Other Ambulatory Visit: Payer: Self-pay | Admitting: Adult Health

## 2019-09-14 DIAGNOSIS — U071 COVID-19: Secondary | ICD-10-CM

## 2019-09-14 NOTE — Progress Notes (Signed)
I connected by phone with [name] on 09/14/2019 at 10:33 AM to discuss the potential use of a new treatment for mild to moderate COVID-19 viral infection in non-hospitalized patients.  This patient is a 49 y.o. female that meets the FDA criteria for Emergency Use Authorization of COVID monoclonal antibody casirivimab/imdevimab.  Has a (+) direct SARS-CoV-2 viral test result  Has mild or moderate COVID-19   Is NOT hospitalized due to COVID-19  Is within 10 days of symptom onset  Has at least one of the high risk factor(s) for progression to severe COVID-19 and/or hospitalization as defined in EUA.  Specific high risk criteria : Older age (>/= 50 yo) and Other high risk medical condition per CDC:  socially vulnerable group   I have spoken and communicated the following to the patient or parent/caregiver regarding COVID monoclonal antibody treatment:  1. FDA has authorized the emergency use for the treatment of mild to moderate COVID-19 in adults and pediatric patients with positive results of direct SARS-CoV-2 viral testing who are 48 years of age and older weighing at least 40 kg, and who are at high risk for progressing to severe COVID-19 and/or hospitalization.  2. The significant known and potential risks and benefits of COVID monoclonal antibody, and the extent to which such potential risks and benefits are unknown.  3. Information on available alternative treatments and the risks and benefits of those alternatives, including clinical trials.  4. Patients treated with COVID monoclonal antibody should continue to self-isolate and use infection control measures (e.g., wear mask, isolate, social distance, avoid sharing personal items, clean and disinfect "high touch" surfaces, and frequent handwashing) according to CDC guidelines.   5. The patient or parent/caregiver has the option to accept or refuse COVID monoclonal antibody treatment.  After reviewing this information with the  patient, The patient agreed to proceed with receiving casirivimab\imdevimab infusion and will be provided a copy of the Fact sheet prior to receiving the infusion. Scot Dock 09/14/2019 10:33 AM

## 2019-09-15 ENCOUNTER — Ambulatory Visit (HOSPITAL_COMMUNITY)
Admission: RE | Admit: 2019-09-15 | Discharge: 2019-09-15 | Disposition: A | Discharge status: Home / Self Care | Payer: Medicaid Other | Source: Ambulatory Visit | Attending: Pulmonary Disease | Admitting: Pulmonary Disease

## 2019-09-15 DIAGNOSIS — U071 COVID-19: Secondary | ICD-10-CM | POA: Diagnosis present

## 2019-09-15 MED ORDER — ALBUTEROL SULFATE HFA 108 (90 BASE) MCG/ACT IN AERS: 2.0000 | INHALATION_SPRAY | Freq: Once | RESPIRATORY_TRACT | Status: DC | PRN

## 2019-09-15 MED ORDER — METHYLPREDNISOLONE SODIUM SUCC 125 MG IJ SOLR: 125.0000 mg | Freq: Once | INTRAMUSCULAR | Status: DC | PRN

## 2019-09-15 MED ORDER — SODIUM CHLORIDE 0.9 % IV SOLN: INTRAVENOUS | Status: DC | PRN

## 2019-09-15 MED ORDER — DIPHENHYDRAMINE HCL 50 MG/ML IJ SOLN: 50.0000 mg | Freq: Once | INTRAMUSCULAR | Status: DC | PRN

## 2019-09-15 MED ORDER — SODIUM CHLORIDE 0.9 % IV SOLN
1200.0000 mg | Freq: Once | INTRAVENOUS | Status: AC
  Administered 2019-09-15: 1200 mg via INTRAVENOUS

## 2019-09-15 MED ORDER — EPINEPHRINE 0.3 MG/0.3ML IJ SOAJ: 0.3000 mg | Freq: Once | INTRAMUSCULAR | Status: DC | PRN

## 2019-09-15 MED ORDER — FAMOTIDINE IN NACL 20-0.9 MG/50ML-% IV SOLN: 20.0000 mg | Freq: Once | INTRAVENOUS | Status: DC | PRN

## 2019-09-15 NOTE — Progress Notes (Signed)
  Diagnosis: COVID-19  Physician:  Procedure: [facility] Infusion [facility] Med: casirivimab\imdevimab infusion - Provided patient with casirivimab\imdevimab fact sheet for patients, parents and caregivers prior to infusion.  Complications: No immediate complications noted.  Discharge: Discharged home   Dorene Sorrow 09/15/2019

## 2019-09-15 NOTE — Discharge Instructions (Signed)

## 2019-12-09 ENCOUNTER — Other Ambulatory Visit: Payer: Self-pay | Admitting: Physician Assistant

## 2019-12-09 DIAGNOSIS — K862 Cyst of pancreas: Secondary | ICD-10-CM

## 2019-12-27 ENCOUNTER — Other Ambulatory Visit: Payer: Medicaid Other

## 2020-02-27 ENCOUNTER — Ambulatory Visit
Admission: RE | Admit: 2020-02-27 | Discharge: 2020-02-27 | Disposition: A | Discharge status: Home / Self Care | Payer: Medicaid Other | Source: Ambulatory Visit | Attending: Physician Assistant | Admitting: Physician Assistant

## 2020-02-27 DIAGNOSIS — K862 Cyst of pancreas: Secondary | ICD-10-CM

## 2020-02-27 MED ORDER — GADOBENATE DIMEGLUMINE 529 MG/ML IV SOLN
15.0000 mL | Freq: Once | INTRAVENOUS | Status: AC | PRN
  Administered 2020-02-27: 15 mL via INTRAVENOUS

## 2020-03-02 ENCOUNTER — Other Ambulatory Visit: Payer: Self-pay | Admitting: Gastroenterology

## 2020-04-22 ENCOUNTER — Other Ambulatory Visit (HOSPITAL_COMMUNITY)
Admission: RE | Admit: 2020-04-22 | Discharge: 2020-04-22 | Disposition: A | Discharge status: Home / Self Care | Payer: Medicaid Other | Source: Ambulatory Visit | Attending: Gastroenterology | Admitting: Gastroenterology

## 2020-04-22 DIAGNOSIS — Z20822 Contact with and (suspected) exposure to covid-19: Secondary | ICD-10-CM | POA: Insufficient documentation

## 2020-04-22 DIAGNOSIS — Z01812 Encounter for preprocedural laboratory examination: Secondary | ICD-10-CM | POA: Diagnosis present

## 2020-04-23 LAB — SARS CORONAVIRUS 2 (TAT 6-24 HRS): SARS Coronavirus 2: NEGATIVE

## 2020-04-26 ENCOUNTER — Ambulatory Visit (HOSPITAL_COMMUNITY): Payer: Medicaid Other | Admitting: Certified Registered Nurse Anesthetist

## 2020-04-26 ENCOUNTER — Encounter (HOSPITAL_COMMUNITY): Payer: Self-pay | Admitting: Gastroenterology

## 2020-04-26 ENCOUNTER — Encounter (HOSPITAL_COMMUNITY)
Admission: RE | Disposition: A | Discharge status: Home / Self Care | Payer: Self-pay | Source: Home / Self Care | Attending: Gastroenterology

## 2020-04-26 ENCOUNTER — Ambulatory Visit (HOSPITAL_COMMUNITY)
Admission: RE | Admit: 2020-04-26 | Discharge: 2020-04-26 | Disposition: A | Discharge status: Home / Self Care | Payer: Medicaid Other | Attending: Gastroenterology | Admitting: Gastroenterology

## 2020-04-26 ENCOUNTER — Other Ambulatory Visit: Payer: Self-pay

## 2020-04-26 DIAGNOSIS — Z8719 Personal history of other diseases of the digestive system: Secondary | ICD-10-CM | POA: Insufficient documentation

## 2020-04-26 DIAGNOSIS — K8010 Calculus of gallbladder with chronic cholecystitis without obstruction: Secondary | ICD-10-CM | POA: Insufficient documentation

## 2020-04-26 DIAGNOSIS — Z791 Long term (current) use of non-steroidal anti-inflammatories (NSAID): Secondary | ICD-10-CM | POA: Diagnosis not present

## 2020-04-26 DIAGNOSIS — Z79899 Other long term (current) drug therapy: Secondary | ICD-10-CM | POA: Diagnosis not present

## 2020-04-26 DIAGNOSIS — K862 Cyst of pancreas: Secondary | ICD-10-CM | POA: Insufficient documentation

## 2020-04-26 HISTORY — PX: ESOPHAGOGASTRODUODENOSCOPY (EGD) WITH PROPOFOL: SHX5813

## 2020-04-26 HISTORY — PX: UPPER ESOPHAGEAL ENDOSCOPIC ULTRASOUND (EUS): SHX6562

## 2020-04-26 HISTORY — DX: Anxiety disorder, unspecified: F41.9

## 2020-04-26 SURGERY — UPPER ESOPHAGEAL ENDOSCOPIC ULTRASOUND (EUS)
Anesthesia: Monitor Anesthesia Care

## 2020-04-26 MED ORDER — LACTATED RINGERS IV SOLN
INTRAVENOUS | Status: DC
  Administered 2020-04-26: 1000 mL via INTRAVENOUS

## 2020-04-26 MED ORDER — LIDOCAINE 2% (20 MG/ML) 5 ML SYRINGE
INTRAMUSCULAR | Status: DC | PRN
  Administered 2020-04-26: 100 mg via INTRAVENOUS

## 2020-04-26 MED ORDER — PROPOFOL 500 MG/50ML IV EMUL
INTRAVENOUS | Status: DC | PRN
  Administered 2020-04-26: 120 ug/kg/min via INTRAVENOUS

## 2020-04-26 MED ORDER — PROPOFOL 500 MG/50ML IV EMUL
INTRAVENOUS | Status: AC
  Filled 2020-04-26: qty 50

## 2020-04-26 MED ORDER — CIPROFLOXACIN IN D5W 400 MG/200ML IV SOLN
INTRAVENOUS | Status: AC
  Filled 2020-04-26: qty 200

## 2020-04-26 MED ORDER — PROPOFOL 10 MG/ML IV BOLUS
INTRAVENOUS | Status: DC | PRN
  Administered 2020-04-26 (×3): 10 mg via INTRAVENOUS
  Administered 2020-04-26: 30 mg via INTRAVENOUS

## 2020-04-26 NOTE — Discharge Instructions (Signed)
Endoscopic Ultrasound (EUS)  Care After  Please read the instructions outlined below and refer to this sheet in the next few weeks. These discharge instructions provide you with general information on caring for yourself after you leave the hospital. Your doctor may also give you specific instructions. While your treatment has been planned according to the most current medical practices available, unavoidable complications occasionally occur. If you have any problems or questions after discharge, please call Dr. Rylee Huestis ([facility]) at [phone].  HOME CARE INSTRUCTIONS  Activity You may resume your regular activity but move at a slower pace for the next 24 hours.  Take frequent rest periods for the next 24 hours.  Walking will help expel (get rid of) the air and reduce the bloated feeling in your abdomen.  No driving for 24 hours (because of the anesthesia (medicine) used during the test).  You may shower.  Do not sign any important legal documents or operate any machinery for 24 hours (because of the anesthesia used during the test).  Nutrition Drink plenty of fluids.  You may resume your normal diet.  Begin with a light meal and progress to your normal diet.  Avoid alcoholic beverages for 24 hours or as instructed by your caregiver.   Medications You may resume your normal medications unless your caregiver tells you otherwise.  What you can expect today You may experience abdominal discomfort such as a feeling of fullness or "gas" pains.  You may experience a sore throat for 2 to 3 days. This is normal. Gargling with salt water may help this.   SEEK IMMEDIATE MEDICAL CARE IF: You have excessive nausea (feeling sick to your stomach) and/or vomiting.  You have severe abdominal pain and distention (swelling).  You have trouble swallowing.  You have a temperature over 100 F (37.8 C).  You have rectal bleeding or vomiting of blood.   Document Released: 08/16/2003  Document Revised: 09/13/2010 Document Reviewed: 02/26/2007 ExitCare Patient Information 2012 [employer].  

## 2020-04-26 NOTE — Anesthesia Postprocedure Evaluation (Signed)
Anesthesia Post Note  Patient: [name]  Procedure(s) Performed: UPPER ESOPHAGEAL ENDOSCOPIC ULTRASOUND (EUS) Linear (N/A )     Patient location during evaluation: Endoscopy Anesthesia Type: MAC Level of consciousness: awake Pain management: pain level controlled Vital Signs Assessment: post-procedure vital signs reviewed and stable Respiratory status: spontaneous breathing, nonlabored ventilation, respiratory function stable and patient connected to nasal cannula oxygen Cardiovascular status: stable and blood pressure returned to baseline Postop Assessment: no apparent nausea or vomiting Anesthetic complications: no   No complications documented.  Last Vitals:  Vitals:   04/26/20 1040 04/26/20 1050  BP: (!) 137/98 140/90  Pulse: 73 68  Resp: 12 14  Temp:    SpO2: 100% 99%    Last Pain:  Vitals:   04/26/20 1040  TempSrc:   PainSc: 0-No pain                 Sian Rockers P Joey Hudock

## 2020-04-26 NOTE — H&P (Signed)
[facility] Admission Note  Chief Complaint: pancreatic cyst  HPI: [name] is an 50 y.o. female.  History of pancreatitis.  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 No abdominal pain.  Past Medical History:  Diagnosis Date  . Anxiety   . Depression    history - no current prob - no meds    Past Surgical History:  Procedure Laterality Date  . CESAREAN SECTION     x 4  . CYSTOSCOPY  12/13/2010   Procedure: CYSTOSCOPY;  Surgeon: Eldred Manges, MD;  Location: [facility];  Service: Gynecology;;  . EYE MUSCLE SURGERY     as child - right eye  . HYSTERECTOMY ABDOMINAL WITH SALPINGECTOMY  11/2010  . TUBAL LIGATION      Medications Prior to Admission  Medication Sig Dispense Refill  . docusate sodium (COLACE) 100 MG capsule Take 100 mg by mouth daily.    . Melatonin 5 MG CAPS Take 5 mg by mouth at bedtime.    Marland Kitchen METAMUCIL FIBER PO Take 1 Scoop by mouth daily as needed (constipation).    . polyethylene glycol (MIRALAX / GLYCOLAX) 17 g packet Take 17 g by mouth daily as needed for moderate constipation.    . valACYclovir (VALTREX) 500 MG tablet Take 500 mg by mouth 2 (two) times daily as needed (outbreaks).    . Vitamin D, Ergocalciferol, (DRISDOL) 1.25 MG (50000 UNIT) CAPS capsule Take 50,000 Units by mouth 3 days.    Marland Kitchen ibuprofen (ADVIL) 200 MG tablet Take 400 mg by mouth every 6 (six) hours as needed for headache or moderate pain.      Allergies: No Known Allergies  History reviewed. No pertinent family history.  Social History:  reports that she has never smoked. She has never used smokeless tobacco. She reports current alcohol use. She reports that she does not use drugs.   ROS: As per HPI, all others negative   Blood pressure 126/77, temperature 98.8 F (37.1 C), temperature source Oral, resp. rate 18, height 5\' 6"  (1.676 m), weight 75 kg, last menstrual period 11/24/2010, SpO2 99 %. General appearance: NAD HEENT:  NCAT, anicteric NEURO:  A/O x  4 CV:  Regular RESP:  No distress ABD:  Soft, non-tender  No results found for this or any previous visit (from the past 48 hour(s)). No results found.  Assessment/Plan  1.  Upper endoscopic ultrasound with possible cyst aspiration. 2.  Risks (bleeding, infection, bowel perforation that could require surgery, sedation-related changes in cardiopulmonary systems), benefits (identification and possible treatment of source of symptoms, exclusion of certain causes of symptoms), and alternatives (watchful waiting, radiographic imaging studies, empiric medical treatment) of upper endoscopy with ultrasound and possible cyst aspiration (EUS +/- FNA) were explained to patient/family in detail and patient wishes to proceed.  Landry Dyke 04/26/2020, 9:58 AM

## 2020-04-26 NOTE — Op Note (Signed)
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 Estimated Blood Loss:     Estimated blood loss: none. Procedure:                Pre-Anesthesia Assessment:                           - Prior to the procedure, a History and Physical                            was performed, and patient medications and                            allergies were reviewed. The patient's tolerance of                            previous anesthesia was also reviewed. The risks                            and benefits of the procedure and the sedation                            options and risks were discussed with the patient.                            All questions were answered, and informed consent                            was obtained. Prior Anticoagulants: The patient has                            taken no previous anticoagulant or antiplatelet                            agents. ASA Grade Assessment: II - A patient with                            mild systemic disease. After reviewing the risks  and benefits, the patient was deemed in                            satisfactory condition to undergo the procedure.                           After obtaining informed consent, the endoscope was                            passed under direct vision. Throughout the                            procedure, the patient's blood pressure, pulse, and                            oxygen saturations were monitored  continuously. The                            [id number] ([id number]) Olympus Linear EUS was                            introduced through the mouth, and advanced to the                            second part of duodenum. The upper EUS was                            accomplished without difficulty. The patient                            tolerated the procedure well. Scope In: Scope Out: Findings:      ENDOSONOGRAPHIC FINDING: :      There was no sign of significant endosonographic abnormality in the       common bile duct.      Many stones were visualized endosonographically in the gallbladder. They       were hyperechoic and characterized by shadowing.      There was no sign of significant endosonographic abnormality in the left       lobe of the liver.      There was no sign of significant endosonographic abnormality in the       pancreatic head, genu of the pancreas and pancreatic body.      No lymphadenopathy seen.      An anechoic lesion suggestive of a cyst was identified in the pancreatic       tail. It is not in obvious communication with the pancreatic duct. The       lesion measured 65 mm by 45 mm in maximal cross-sectional diameter.       There were a few compartments thinly septated. The outer wall of the       lesion was thin. There was no associated mass. There was no internal       debris within the fluid-filled cavity. The cyst is nestled in the hilum       of the spleen, and there was large overlying vasculature between the       scope and the cyst,  thus cyst aspiration was felt to be risk-prohibitive. Impression:               - There was no sign of significant pathology in the                            common bile duct.                           - Many stones were visualized endosonographically                            in the gallbladder.                           - There was no evidence of significant pathology in                            the left lobe of the  liver.                           - There was no sign of significant pathology in the                            pancreatic head, genu of the pancreas and                            pancreatic body.                           - A cystic lesion was seen in the pancreatic tail,                            cyst aspiration risk-prohibitive, as described                            above. Size and characteristics not readily typical                            of pseudocyst (patient's cyst has been present                            since 2005 and her pancreatitis was only a couple                            years ago). Moderate Sedation:      None Recommendation:           - Discharge patient to home (via wheelchair).                           - Resume previous diet today.                           - Continue present medications.                           -  Refer to a pancreatic-biliary surgeon at                            appointment to be scheduled.                           - Return to GI clinic in 4 months. Procedure Code(s):        --- Professional ---                           [phone], Esophagogastroduodenoscopy, flexible,                            transoral; with endoscopic ultrasound examination,                            including the esophagus, stomach, and either the                            duodenum or a surgically altered stomach where the                            jejunum is examined distal to the anastomosis Diagnosis Code(s):        --- Professional ---                           K80.20, Calculus of gallbladder without                            cholecystitis without obstruction                           K86.2, Cyst of pancreas CPT copyright 2019 American [employer] Association. All rights reserved. The codes documented in this report are preliminary and upon coder review may  be revised to meet current compliance requirements. Arta Silence, MD 04/26/2020 10:30:33 AM This  report has been signed electronically. Number of Addenda: 0

## 2020-04-26 NOTE — Anesthesia Preprocedure Evaluation (Addendum)
Anesthesia Evaluation  Patient identified by MRN, date of birth, ID band Patient awake    Reviewed: Allergy & Precautions, NPO status , Patient's Chart, lab work & pertinent test results  Airway Mallampati: II  TM Distance: >3 FB Neck ROM: Full    Dental  (+) Chipped,    Pulmonary neg pulmonary ROS,    Pulmonary exam normal breath sounds clear to auscultation       Cardiovascular negative cardio ROS Normal cardiovascular exam Rhythm:Regular Rate:Normal     Neuro/Psych PSYCHIATRIC DISORDERS Anxiety Depression negative neurological ROS     GI/Hepatic negative GI ROS, Neg liver ROS,   Endo/Other  negative endocrine ROS  Renal/GU negative Renal ROS     Musculoskeletal negative musculoskeletal ROS (+)   Abdominal   Peds  Hematology negative hematology ROS (+)   Anesthesia Other Findings abnormal findings on diagnositc imaging, cyst on pancrease  Reproductive/Obstetrics S/p BTL                            Anesthesia Physical Anesthesia Plan  ASA: II  Anesthesia Plan: MAC   Post-op Pain Management:    Induction: Intravenous  PONV Risk Score and Plan: 2 and Propofol infusion and Treatment may vary due to age or medical condition  Airway Management Planned: Nasal Cannula  Additional Equipment:   Intra-op Plan:   Post-operative Plan:   Informed Consent: I have reviewed the patients History and Physical, chart, labs and discussed the procedure including the risks, benefits and alternatives for the proposed anesthesia with the patient or authorized representative who has indicated his/her understanding and acceptance.     Dental advisory given  Plan Discussed with: CRNA  Anesthesia Plan Comments:        Anesthesia Quick Evaluation

## 2020-04-26 NOTE — Transfer of Care (Signed)
Immediate Anesthesia Transfer of Care Note  Patient: KAHLEE METIVIER  Procedure(s) Performed: UPPER ESOPHAGEAL ENDOSCOPIC ULTRASOUND (EUS) Linear (N/A )  Patient Location: Endoscopy Unit  Anesthesia Type:MAC  Level of Consciousness: drowsy and patient cooperative  Airway & Oxygen Therapy: Patient Spontanous Breathing and Patient connected to face mask oxygen  Post-op Assessment: Report given to RN and Post -op Vital signs reviewed and stable  Post vital signs: Reviewed and stable  Last Vitals:  Vitals Value Taken Time  BP    Temp    Pulse 73 04/26/20 1027  Resp    SpO2 99 % 04/26/20 1027  Vitals shown include unvalidated device data.  Last Pain:  Vitals:   04/26/20 0944  TempSrc: Oral  PainSc: 0-No pain         Complications: No complications documented.

## 2020-04-27 ENCOUNTER — Encounter (HOSPITAL_COMMUNITY): Payer: Self-pay | Admitting: Gastroenterology

## 2020-05-25 ENCOUNTER — Ambulatory Visit: Payer: Medicaid Other | Attending: Nurse Practitioner | Admitting: Physical Therapy

## 2020-05-25 ENCOUNTER — Other Ambulatory Visit: Payer: Self-pay

## 2020-05-25 ENCOUNTER — Encounter: Payer: Self-pay | Admitting: Physical Therapy

## 2020-05-25 DIAGNOSIS — R252 Cramp and spasm: Secondary | ICD-10-CM | POA: Insufficient documentation

## 2020-05-25 DIAGNOSIS — R278 Other lack of coordination: Secondary | ICD-10-CM | POA: Diagnosis present

## 2020-05-26 NOTE — Therapy (Signed)
[facility] [address], [place], [place], [address] Phone: [phone]   Fax:  [phone]  Physical Therapy Evaluation  Patient Details  Name: [name] MRN: [id number] Date of Birth: 05-13-1970 Referring Provider (PT): Vonna Drafts, FNP   Encounter Date: 05/25/2020   PT End of Session - 05/25/20 1655    Visit Number 1    Date for PT Re-Evaluation 08/17/20    Authorization Type [id number] O - CCME    PT Start Time 1616    PT Stop Time 1658    PT Time Calculation (min) 42 min    Activity Tolerance Patient tolerated treatment well    Behavior During Therapy [facility] for tasks assessed/performed           Past Medical History:  Diagnosis Date  . Anxiety   . Depression    history - no current prob - no meds    Past Surgical History:  Procedure Laterality Date  . CESAREAN SECTION     x 4  . CYSTOSCOPY  12/13/2010   Procedure: CYSTOSCOPY;  Surgeon: Eldred Manges, MD;  Location: [facility];  Service: Gynecology;;  . ESOPHAGOGASTRODUODENOSCOPY (EGD) WITH PROPOFOL N/A 04/26/2020   Procedure: ESOPHAGOGASTRODUODENOSCOPY (EGD) WITH PROPOFOL;  Surgeon: Arta Silence, MD;  Location: [facility];  Service: Endoscopy;  Laterality: N/A;  . EYE MUSCLE SURGERY     as child - right eye  . HYSTERECTOMY ABDOMINAL WITH SALPINGECTOMY  11/2010  . TUBAL LIGATION    . UPPER ESOPHAGEAL ENDOSCOPIC ULTRASOUND (EUS) N/A 04/26/2020   Procedure: UPPER ESOPHAGEAL ENDOSCOPIC ULTRASOUND (EUS) Linear;  Surgeon: Arta Silence, MD;  Location: [facility];  Service: Endoscopy;  Laterality: N/A;    There were no vitals filed for this visit.    Subjective Assessment - 05/25/20 1619    Subjective Pt has had constipation for years and has been getting worse in the last year.  Taking mirilax and stool softener when she hasn't gone for a while. Pt would normally have 3 days BM and maybe one day no BM, but not only 1/week.  Pt noticed  maybe after last child when it started, but not sure.  Denies any leakage of urine, fecal, or gas. When having a BM it comes out very slowly.    Patient Stated Goals have regular BMs and no abdominal pain              [facility] PT Assessment - 05/26/20 0001      Assessment   Medical Diagnosis K59.09 (ICD-10-CM) - Other constipation    Referring Provider (PT) Vonna Drafts, FNP    Onset Date/Surgical Date --   years   Prior Therapy No      Precautions   Precautions None      Balance Screen   Has the patient fallen in the past 6 months No      [place] residence    Living Arrangements Children   2 boys     Prior Function   Level of Independence Independent    Vocation Full time employment      Cognition   Overall Cognitive Status Within Functional Limits for tasks assessed      Functional Tests   Functional tests Squat      Squat   Comments core weakness - excess trunk flexion      Posture/Postural Control   Posture/Postural Control Postural limitations    Postural Limitations Rounded Shoulders;Increased lumbar  lordosis;Anterior pelvic tilt      ROM / Strength   AROM / PROM / Strength Strength;PROM      PROM   Overall PROM Comments Rt hip flex and ER 50%; Lt hip ER 75%      Strength   Overall Strength Comments 5/5 MMT bil LE      Flexibility   Soft Tissue Assessment /Muscle Length yes    Hamstrings Rt 75%; Lt 80%      Palpation   Palpation comment tight lumbar, gluteals      Ambulation/Gait   Gait Pattern Within Functional Limits                      Objective measurements completed on examination: See above findings.     Pelvic Floor Special Questions - 05/26/20 0001    Are you Pregnant or attempting pregnancy? No    Prior Pregnancies Yes    Number of Pregnancies 4    Number of C-Sections 4    Currently Sexually Active Yes    Is this Painful No    Marinoff Scale no problems    Fluid intake 8  oz    Falling out feeling (prolapse) No    External Palpation bil levators tight    Prolapse --   very slight anterior/posterior   Pelvic Floor Internal Exam Pt identity confirmed and informed consent given to perform internal soft tissue assessment    Exam Type Rectal    Palpation tight and hypertrophic anal sphincters; after contracting she is unable to relax    Strength good squeeze, good lift, able to hold agaisnt strong resistance    Strength # of reps 1    Strength # of seconds 3    Tone high                         PT Long Term Goals - 05/26/20 1146      PT LONG TERM GOAL #1   Title Pt will be able to have at least 4 BM/week    Baseline 1/week    Time 12    Period Weeks    Status New    Target Date 08/17/20      PT LONG TERM GOAL #2   Title Pt will be ind with advanced HEP    Baseline does not know    Time 12    Period Weeks    Status New    Target Date 08/17/20      PT LONG TERM GOAL #3   Title pt will demonstrate improved lifestyle habits to reduce risk of future health complications    Baseline drinks 8 oz water, not regularly walking/exercising    Time 12    Period Weeks    Status New    Target Date 08/17/20      PT LONG TERM GOAL #4   Title Pt will report at least 50% less abdominal pain    Time 12    Period Weeks    Status New    Target Date 08/17/20                  Plan - 05/26/20 1154    Clinical Impression Statement Pt is friendly 50 y/o female coming to our clinic due to chronic constipation that has been worsening.  pt has complex history of abdoninal surgeries as mentioned below.  Pt has tight hip flexors, anterior pelvic tilt and increased  lumbar lordosis.  She has limited abdominal strength and significant tension in scar tissue in lower abdomen from 4 c-sections.  Pt has 5/5 hip strength, but limited ROM of Rt hip flexion and external rotation and Lt hip ER.  pt has weak core demonstrated by excess trunk flexion during  functional squat.  Pt has TTP of bil levators when palpated externally.  Pt has high tone pelvic floor and unable to bulge when bearingdown.  Pt has 4/5 strength when assessed rectally, but low endurance and then unable to fully relax needing extra time and cues to do so.  Pt will benefit from skilled PT to address impairments and restore maximum function and improved bowel health.    Personal Factors and Comorbidities Comorbidity 3+    Comorbidities c-sectionx4; hysterectomy; history of tubaligation and fibroid uterine    Examination-Activity Limitations Toileting;Squat    Stability/Clinical Decision Making Evolving/Moderate complexity    Clinical Decision Making Moderate    Rehab Potential Excellent    PT Frequency 1x / week    PT Duration 12 weeks    PT Treatment/Interventions ADLs/Self Care Home Management;Biofeedback;Cryotherapy;Electrical Stimulation;Moist Heat;Therapeutic activities;Therapeutic exercise;Neuromuscular re-education;Taping;Dry needling;Manual techniques;Patient/family education    PT Next Visit Plan scar massage, abdominal massage, down training and breathing positions    Consulted and Agree with Plan of Care Patient           Patient will benefit from skilled therapeutic intervention in order to improve the following deficits and impairments:  Pain,Postural dysfunction,Impaired flexibility,Increased fascial restricitons,Decreased strength,Decreased coordination,Impaired tone,Decreased endurance,Increased muscle spasms  Visit Diagnosis: Cramp and spasm  Other lack of coordination     Problem List Patient Active Problem List   Diagnosis Date Noted  . Menorrhagia 12/13/2010  . Fibroid, uterine 12/13/2010  . Dysmenorrhea 12/13/2010    [name], PT 05/26/2020, 12:13 PM  [facility] [address] [place], [place], [address] Phone: [phone]   Fax:  [phone]  Name: [name] MRN: [id number] Date of Birth: 14-May-1970

## 2020-06-09 ENCOUNTER — Ambulatory Visit: Payer: Medicaid Other | Admitting: Physical Therapy

## 2020-06-23 ENCOUNTER — Encounter: Payer: Self-pay | Admitting: Physical Therapy

## 2020-06-23 ENCOUNTER — Other Ambulatory Visit: Payer: Self-pay

## 2020-06-23 ENCOUNTER — Ambulatory Visit: Payer: Medicaid Other | Attending: Nurse Practitioner | Admitting: Physical Therapy

## 2020-06-23 DIAGNOSIS — R278 Other lack of coordination: Secondary | ICD-10-CM | POA: Diagnosis present

## 2020-06-23 DIAGNOSIS — R252 Cramp and spasm: Secondary | ICD-10-CM

## 2020-06-23 NOTE — Patient Instructions (Signed)

## 2020-06-23 NOTE — Therapy (Signed)
[facility] [address] [place], [place], [address] Phone: [phone]   Fax:  [phone]  Physical Therapy Treatment  Patient Details  Name: [name] MRN: [id number] Date of Birth: 02-Sep-1970 Referring Provider (PT): Vonna Drafts, FNP   Encounter Date: 06/23/2020   PT End of Session - 06/23/20 1537     Visit Number 2    Date for PT Re-Evaluation 08/17/20    Authorization Type [id number] O - CCME 3 to 6/15    Authorization - Visit Number 1    Authorization - Number of Visits 3    PT Start Time 1533    PT Stop Time 1613    PT Time Calculation (min) 40 min    Activity Tolerance Patient tolerated treatment well    Behavior During Therapy [facility] for tasks assessed/performed             Past Medical History:  Diagnosis Date   Anxiety    Depression    history - no current prob - no meds    Past Surgical History:  Procedure Laterality Date   CESAREAN SECTION     x 4   CYSTOSCOPY  12/13/2010   Procedure: CYSTOSCOPY;  Surgeon: Eldred Manges, MD;  Location: [place] [facility];  Service: Gynecology;;   ESOPHAGOGASTRODUODENOSCOPY (EGD) WITH PROPOFOL N/A 04/26/2020   Procedure: ESOPHAGOGASTRODUODENOSCOPY (EGD) WITH PROPOFOL;  Surgeon: Arta Silence, MD;  Location: [facility];  Service: Endoscopy;  Laterality: N/A;   EYE MUSCLE SURGERY     as child - right eye   HYSTERECTOMY ABDOMINAL WITH SALPINGECTOMY  11/2010   TUBAL LIGATION     UPPER ESOPHAGEAL ENDOSCOPIC ULTRASOUND (EUS) N/A 04/26/2020   Procedure: UPPER ESOPHAGEAL ENDOSCOPIC ULTRASOUND (EUS) Linear;  Surgeon: Arta Silence, MD;  Location: [facility];  Service: Endoscopy;  Laterality: N/A;    There were no vitals filed for this visit.   Subjective Assessment - 06/23/20 1533     Subjective Pt is about the same having a few BMs with mirilax and stool softer but then doesn't go for a while.    Patient Stated Goals have regular BMs and no abdominal  pain    Currently in Pain? No/denies                               [facility] Adult PT Treatment/Exercise - 06/23/20 0001       Self-Care   Self-Care Other Self-Care Comments    Other Self-Care Comments  toileting and abdominal massage      Exercises   Exercises Other Exercises    Other Exercises  educated and performed exercises in initial HEP - hapy baby, child pose, piriformis      Manual Therapy   Manual Therapy Soft tissue mobilization    Soft tissue mobilization scar mobs and fascial release around abdomen                    PT Education - 06/23/20 1610     Education Details Access Code: [email or web address]  URL: [email or web address]  Date: 06/23/2020  Prepared by: Jari Favre    Exercises  Supine Pelvic Floor Stretch - Hands on Knees - 1 x daily - 7 x weekly - 1 sets - 3 reps - 30 hold  Supine Piriformis Stretch - 1 x daily - 7 x weekly - 1 sets - 3 reps - 30 sec hold  Child's Pose with Sidebending -  1 x daily - 7 x weekly - 1 sets - 3 reps - 30 sec hold    Person(s) Educated Patient    Methods Explanation;Demonstration;Tactile cues;Verbal cues;Handout    Comprehension Verbalized understanding;Returned demonstration                 PT Long Term Goals - 05/26/20 1146       PT LONG TERM GOAL #1   Title Pt will be able to have at least 4 BM/week    Baseline 1/week    Time 12    Period Weeks    Status New    Target Date 08/17/20      PT LONG TERM GOAL #2   Title Pt will be ind with advanced HEP    Baseline does not know    Time 12    Period Weeks    Status New    Target Date 08/17/20      PT LONG TERM GOAL #3   Title pt will demonstrate improved lifestyle habits to reduce risk of future health complications    Baseline drinks 8 oz water, not regularly walking/exercising    Time 12    Period Weeks    Status New    Target Date 08/17/20      PT LONG TERM GOAL #4   Title Pt will report at least 50% less abdominal  pain    Time 12    Period Weeks    Status New    Target Date 08/17/20                   Plan - 06/23/20 1611     Clinical Impression Statement Pt had some release of scar tissue after manual techniques for scar tissue release.  Today's focus was on educating patient to relax and bulge and using breathing techniques with toileting. Pt will benfit from skilled PT to continue working towards goals.    PT Treatment/Interventions ADLs/Self Care Home Management;Biofeedback;Cryotherapy;Electrical Stimulation;Moist Heat;Therapeutic activities;Therapeutic exercise;Neuromuscular re-education;Taping;Dry needling;Manual techniques;Patient/family education    PT Next Visit Plan down training and breathing positions, internal soft tissue mbos and/or lumbar and gluteal    PT Home Exercise Plan Access Code: [email or web address]    Consulted and Agree with Plan of Care Patient             Patient will benefit from skilled therapeutic intervention in order to improve the following deficits and impairments:  Pain, Postural dysfunction, Impaired flexibility, Increased fascial restricitons, Decreased strength, Decreased coordination, Impaired tone, Decreased endurance, Increased muscle spasms  Visit Diagnosis: Cramp and spasm  Other lack of coordination     Problem List Patient Active Problem List   Diagnosis Date Noted   Menorrhagia 12/13/2010   Fibroid, uterine 12/13/2010   Dysmenorrhea 12/13/2010    [name], PT 06/23/2020, 4:15 PM  [facility] [address] [place], [place], [address] Phone: [phone]   Fax:  [phone]  Name: [name] MRN: [id number] Date of Birth: Jun 01, 1970

## 2020-06-29 ENCOUNTER — Ambulatory Visit: Payer: Self-pay | Admitting: Surgery

## 2020-06-29 NOTE — H&P (Signed)
History of Present Illness ([name] L. Zenia Resides MD; 06/29/2020 2:57 PM) The patient is a 50 year old female who presents with a pancreatic mass.Ms. [name] is a 50 yo female who was referred with an enlarging cystic mass in the tail of the pancreas. The cyst was first identified in 2005 when she underwent a CT scan for postop workup after a C section. In May 2020, she had acute pancreatitis involving the tail of the pancreas. At that time the cyst was measured at 3.9. She did not require inpatient admission for her pancreatitis and the etiology was never clearly identified, although she was told it could be due to gallstones. More recently she decided to undergo further workup of the cyst and an MRCP was done on 02/27/20, which showed growth of the cyst to 7cm. She subsequently had an EUS by Dr. Paulita Fujita on 4/12, which showed a septated cyst. There was no obvious ductal communication and no mural nodules. FNA was not performed due to the presence of a large blood vessel adjacent to the cyst. She was referred to me to discuss surgical resection. She says she has occasional lower abdominal pain and constipation but otherwise no GI symptoms. She has not had any further episodes of pancreatitis and her episode in 2020 was the only time she has ever had pancreatitis.  PMH: anxiety/depression  PSH: C section, hysterectomy, tubal ligation  FHx: Denies family history of hepatobiliary or pancreatic cancers.  Social: Denies use of tobacco. Occasional EtOH use.    Past Surgical History [name], CMA; 06/29/2020 1:47 PM) Cesarean Section - Multiple   Colon Polyp Removal - Colonoscopy   Hysterectomy (not due to cancer) - Partial    Diagnostic Studies History [name], CMA; 06/29/2020 1:47 PM) Colonoscopy   within last year Mammogram   1-3 years ago  Social History [name], CMA; 06/29/2020 1:47 PM) Alcohol use   Occasional alcohol use. Caffeine use   Carbonated beverages, Coffee, Tea. No drug use   Tobacco  use   Never smoker.  Family History [name], CMA; 06/29/2020 1:47 PM) Alcohol Abuse   Brother, Father, Mother. Arthritis   Father, Mother. Cerebrovascular Accident   Brother. Depression   Brother, Mother. Diabetes Mellitus   Brother, Father, Mother. Hypertension   Brother, Father, Mother. Kidney Disease   Mother. Prostate Cancer   Brother. Seizure disorder   Brother.  Pregnancy / Birth History [name], CMA; 06/29/2020 1:47 PM) Age at menarche   49 years. Age of menopause   33-50 Maternal age   40-20 Para   44  Other Problems [name], CMA; 06/29/2020 1:47 PM) Anxiety Disorder   Depression   Pancreatitis      Review of Systems [name] CMA; 06/29/2020 1:47 PM) HEENT Present- Wears glasses/contact lenses. Not Present- Earache, Hearing Loss, Hoarseness, Nose Bleed, Oral Ulcers, Ringing in the Ears, Seasonal Allergies, Sinus Pain, Sore Throat, Visual Disturbances and Yellow Eyes. Respiratory Present- Snoring. Not Present- Bloody sputum, Chronic Cough, Difficulty Breathing and Wheezing. Cardiovascular Not Present- Chest Pain, Difficulty Breathing Lying Down, Leg Cramps, Palpitations, Rapid Heart Rate, Shortness of Breath and Swelling of Extremities. Gastrointestinal Present- Bloating, Change in Bowel Habits, Constipation and Hemorrhoids. Not Present- Abdominal Pain, Bloody Stool, Chronic diarrhea, Difficulty Swallowing, Excessive gas, Gets full quickly at meals, Indigestion, Nausea, Rectal Pain and Vomiting. Female Genitourinary Present- Frequency. Not Present- Nocturia, Painful Urination, Pelvic Pain and Urgency. Musculoskeletal Not Present- Back Pain, Joint Pain, Joint Stiffness, Muscle Pain, Muscle Weakness and Swelling of Extremities.  Neurological Not Present- Decreased Memory, Fainting, Headaches, Numbness, Seizures, Tingling, Tremor, Trouble walking and Weakness. Psychiatric Present- Anxiety, Change in Sleep Pattern and Depression. Not Present- Bipolar, Fearful and  Frequent crying. Endocrine Present- Hot flashes. Not Present- Cold Intolerance, Excessive Hunger, Hair Changes, Heat Intolerance and New Diabetes.   Physical Exam ([name] L. Zenia Resides MD; 06/29/2020 2:56 PM) The physical exam findings are as follows: Note:  Constitutional: No acute distress; conversant; no deformities Neuro: alert and oriented; cranial nerves grossly in tact; no focal deficits Eyes: Moist conjunctiva; anicteric sclerae; extraocular movements in tact Neck: Trachea midline Lungs: Normal respiratory effort; lungs clear to auscultation bilaterally; symmetric chest wall expansion CV: Regular rate and rhythm; no murmurs; no pitting edema GI: Abdomen soft, nontender, nondistended; no masses or organomegaly MSK: Normal gait and station; no clubbing/cyanosis Psychiatric: Appropriate affect; alert and oriented 3    Assessment & Plan (Natasha Burda L. 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 She is currently asymptomatic, but has previously had pancreatitis involving the tail of the pancreas. I personally reviewed her imaging including MRCP and CT scans dating back to 2005. The cyst has had interval growth in the last 2 years. In 2020 she had significant surrounding inflammation consistent with pancreatitis. I do not think gallstones caused her pancreatitis, and triglycerides were not checked at the time. Given her age, female gender and location in the tail, a mucinous cystic neoplasm is high on the differential diagnosis. This could also represent a serous cystic neoplasm or IPMN. Given her prior pancreatitis a pseudocyst is possible but I think unlikely since this was present long before she had pancreatitis. Since it is growing and an MCN cannot be ruled out, and she had associated pancreatitis, I recommend surgical resection due to malignant potential of MCN. This will require a distal pancreatectomy and  likely splenectomy given the proximity to the spleen. I will perform a cholecystectomy at the same time since she has numerous gallstones. Will attempt to resect this laparoscopically but I counseled her that it may be necessary to open if extensive adhesions are present from her prior pancreatitis or if bleeding is encountered. I discussed the details of distal pancreatectomy and the benefits and risks, including bleeding, infection, 20% risk of pancreatic fistula, and risk of post-splenectomy sepsis. She will need to get splenectomy vaccines prior to surgery (given prescription today). She has plans in July and would like to wait until August to have surgery, which I think is safe. I will present her case at GI conference next week for imaging review. Labs and tumor markers will be sent today.  Michaelle Birks, MD [facility] Surgery General, Hepatobiliary and Pancreatic Surgery 06/29/20 3:07 PM

## 2020-06-30 ENCOUNTER — Ambulatory Visit: Payer: Medicaid Other | Admitting: Physical Therapy

## 2020-06-30 ENCOUNTER — Other Ambulatory Visit: Payer: Self-pay

## 2020-06-30 ENCOUNTER — Encounter: Payer: Self-pay | Admitting: Physical Therapy

## 2020-06-30 DIAGNOSIS — R252 Cramp and spasm: Secondary | ICD-10-CM | POA: Diagnosis not present

## 2020-06-30 DIAGNOSIS — R278 Other lack of coordination: Secondary | ICD-10-CM

## 2020-06-30 NOTE — Therapy (Signed)
[facility] [address] [place], [place], [address] Phone: [phone]   Fax:  [phone]  Physical Therapy Treatment  Patient Details  Name: [name] MRN: [id number] Date of Birth: 1970-12-09 Referring Provider (PT): Vonna Drafts, FNP   Encounter Date: 06/30/2020   PT End of Session - 06/30/20 1542     Visit Number 3    Date for PT Re-Evaluation 08/17/20    Authorization Type [id number] O - CCME 3 to 6/15    Authorization - Visit Number 2    Authorization - Number of Visits 3    PT Start Time 1533    PT Stop Time 1613    PT Time Calculation (min) 40 min    Activity Tolerance Patient tolerated treatment well    Behavior During Therapy [facility] for tasks assessed/performed             Past Medical History:  Diagnosis Date   Anxiety    Depression    history - no current prob - no meds    Past Surgical History:  Procedure Laterality Date   CESAREAN SECTION     x 4   CYSTOSCOPY  12/13/2010   Procedure: CYSTOSCOPY;  Surgeon: Eldred Manges, MD;  Location: [facility];  Service: Gynecology;;   ESOPHAGOGASTRODUODENOSCOPY (EGD) WITH PROPOFOL N/A 04/26/2020   Procedure: ESOPHAGOGASTRODUODENOSCOPY (EGD) WITH PROPOFOL;  Surgeon: Arta Silence, MD;  Location: [facility];  Service: Endoscopy;  Laterality: N/A;   EYE MUSCLE SURGERY     as child - right eye   HYSTERECTOMY ABDOMINAL WITH SALPINGECTOMY  11/2010   TUBAL LIGATION     UPPER ESOPHAGEAL ENDOSCOPIC ULTRASOUND (EUS) N/A 04/26/2020   Procedure: UPPER ESOPHAGEAL ENDOSCOPIC ULTRASOUND (EUS) Linear;  Surgeon: Arta Silence, MD;  Location: [facility];  Service: Endoscopy;  Laterality: N/A;    There were no vitals filed for this visit.   Subjective Assessment - 06/30/20 1538     Subjective Pt is still drinking more water, medication if haven't gone for a week start taking, but then it stop working.  Exercising more walking.    Patient Stated Goals  have regular BMs and no abdominal pain    Currently in Pain? No/denies                            Pelvic Floor Special Questions - 06/30/20 0001     Pelvic Floor Internal Exam Pt identity confirmed and informed consent given to perform internal soft tissue assessment    Exam Type Rectal    Palpation difficulty relax after contracting, was able to bulge and push finger out    Strength good squeeze, good lift, able to hold agaisnt strong resistance    Strength # of reps 3    Strength # of seconds 3    Tone moderate               [facility] Adult PT Treatment/Exercise - 06/30/20 0001       Exercises   Exercises Lumbar      Lumbar Exercises: Stretches   Active Hamstring Stretch Right;Left;1 rep;30 seconds    Piriformis Stretch Limitations both 30 sec each supine      Lumbar Exercises: Quadruped   Madcat/Old Horse 5 reps    Madcat/Old Horse Limitations needed  a lot of cues    Other Quadruped Lumbar Exercises child pose      Manual Therapy   Soft tissue  mobilization lumbar paraspinals              Trigger Point Dry Needling - 06/30/20 0001     Consent Given? Yes    Education Handout Provided Previously provided    Muscles Treated Back/Hip Lumbar multifidi    Lumbar multifidi Response Twitch response elicited;Palpable increased muscle length                  PT Education - 06/30/20 1617     Education Details Access Code: [email or web address]    Person(s) Educated Patient    Methods Explanation;Demonstration;Tactile cues;Verbal cues;Handout    Comprehension Verbalized understanding;Returned demonstration                 PT Long Term Goals - 06/30/20 1719       PT LONG TERM GOAL #1   Title Pt will be able to have at least 4 BM/week    Baseline 1/week    Status On-going      PT LONG TERM GOAL #2   Title Pt will be ind with advanced HEP    Status On-going      PT LONG TERM GOAL #3   Title pt will demonstrate improved lifestyle habits  to reduce risk of future health complications    Baseline more water and is taking walks    Status Achieved      PT LONG TERM GOAL #4   Title Pt will report at least 50% less abdominal pain    Baseline same    Status On-going                   Plan - 06/30/20 1618     Clinical Impression Statement Pt responded well to STM to lumbar paraspinals today.  Pt was re-assessed to pelvic floor and STM done to bil coccygeus and puborectalis.  Pt is better at releaxing expecially with cues to take slow deep breath in.  She was able to bulge muscles correctly.  She is still having the same amount of constipation but today was the first day doing manual techniques with addition of dry needling as well as working on back. Pt has extensive tension throughout her entire back and neck which can be restrictive to the fascial lines all the way down to the pelvic floor and causing some of the constipation.  Pt will benefit from skilled PT to continue to address impairments and ensure that she is good with her HEP for improved function and quality of life    Comorbidities c-sectionx4; hysterectomy; history of tubaligation and fibroid uterine    PT Treatment/Interventions ADLs/Self Care Home Management;Biofeedback;Cryotherapy;Electrical Stimulation;Moist Heat;Therapeutic activities;Therapeutic exercise;Neuromuscular re-education;Taping;Dry needling;Manual techniques;Patient/family education    PT Next Visit Plan f/u on stretches added and DN #1 to lumbar; continue DN adding thoracic multifidi; add rotation stretches    PT Home Exercise Plan Access Code: [email or web address]    Consulted and Agree with Plan of Care Patient             Patient will benefit from skilled therapeutic intervention in order to improve the following deficits and impairments:  Pain, Postural dysfunction, Impaired flexibility, Increased fascial restricitons, Decreased strength, Decreased coordination, Impaired tone, Decreased endurance,  Increased muscle spasms  Visit Diagnosis: Cramp and spasm  Other lack of coordination     Problem List Patient Active Problem List   Diagnosis Date Noted   Menorrhagia 12/13/2010   Fibroid, uterine 12/13/2010   Dysmenorrhea 12/13/2010    [name], PT  06/30/2020, 5:26 PM  [facility] [address], [place], [place], [address] Phone: [phone]   Fax:  [phone]  Name: CAMRI MOLLOY MRN: [id number] Date of Birth: 1970/08/17

## 2020-06-30 NOTE — Patient Instructions (Signed)
Access Code: [email or web address] URL: [email or web address] Date: 06/30/2020 Prepared by: Jari Favre  Exercises Supine Pelvic Floor Stretch - Hands on Knees - 1 x daily - 7 x weekly - 1 sets - 3 reps - 30 hold Supine Piriformis Stretch - 1 x daily - 7 x weekly - 1 sets - 3 reps - 30 sec hold Child's Pose with Sidebending - 1 x daily - 7 x weekly - 1 sets - 3 reps - 30 sec hold Supine Hamstring Stretch with Strap - 1 x daily - 7 x weekly - 3 reps - 1 sets - 30 sec hold Supine Piriformis Stretch with Leg Straight - 1 x daily - 7 x weekly - 3 reps - 1 sets - 30 sec hold Cat Cow to Child's Pose - 1 x daily - 7 x weekly - 5 reps - 1 sets - 10 sec hold  Patient Education Trigger Point Dry Needling Office Posture

## 2020-07-06 ENCOUNTER — Other Ambulatory Visit: Payer: Self-pay

## 2020-07-06 NOTE — Progress Notes (Signed)
The proposed treatment discussed in conference is for discussion purpose only and is not a binding recommendation.  The patients have not been physically examined, or presented with their treatment options.  Therefore, final treatment plans cannot be decided.  

## 2020-07-07 ENCOUNTER — Ambulatory Visit: Payer: Medicaid Other | Admitting: Physical Therapy

## 2020-07-12 ENCOUNTER — Ambulatory Visit: Payer: Medicaid Other | Admitting: Physical Therapy

## 2020-07-12 ENCOUNTER — Other Ambulatory Visit: Payer: Self-pay

## 2020-07-12 DIAGNOSIS — R278 Other lack of coordination: Secondary | ICD-10-CM

## 2020-07-12 DIAGNOSIS — R252 Cramp and spasm: Secondary | ICD-10-CM

## 2020-07-12 NOTE — Therapy (Signed)
[facility] [address] [place], [place], [address] Phone: [phone]   Fax:  [phone]  Physical Therapy Treatment  Patient Details  Name: [name] MRN: [id number] Date of Birth: 01/23/1970 Referring Provider (PT): Vonna Drafts, FNP   Encounter Date: 07/12/2020   PT End of Session - 07/12/20 1657     Visit Number 4    Date for PT Re-Evaluation 08/17/20    Authorization Type [id number] O - CCME 3 to 7/13    Authorization - Visit Number 1    Authorization - Number of Visits 3    PT Start Time 1620    PT Stop Time 0932    PT Time Calculation (min) 30 min             Past Medical History:  Diagnosis Date   Anxiety    Depression    history - no current prob - no meds    Past Surgical History:  Procedure Laterality Date   CESAREAN SECTION     x 4   CYSTOSCOPY  12/13/2010   Procedure: CYSTOSCOPY;  Surgeon: Eldred Manges, MD;  Location: [facility];  Service: Gynecology;;   ESOPHAGOGASTRODUODENOSCOPY (EGD) WITH PROPOFOL N/A 04/26/2020   Procedure: ESOPHAGOGASTRODUODENOSCOPY (EGD) WITH PROPOFOL;  Surgeon: Arta Silence, MD;  Location: [facility];  Service: Endoscopy;  Laterality: N/A;   EYE MUSCLE SURGERY     as child - right eye   HYSTERECTOMY ABDOMINAL WITH SALPINGECTOMY  11/2010   TUBAL LIGATION     UPPER ESOPHAGEAL ENDOSCOPIC ULTRASOUND (EUS) N/A 04/26/2020   Procedure: UPPER ESOPHAGEAL ENDOSCOPIC ULTRASOUND (EUS) Linear;  Surgeon: Arta Silence, MD;  Location: [facility];  Service: Endoscopy;  Laterality: N/A;    There were no vitals filed for this visit.   Subjective Assessment - 07/12/20 1701     Subjective Pt states she is drinking more water and walked a lot last weekend.  Had 3 BMs this last week and no abdominal pain.    Patient Stated Goals have regular BMs and no abdominal pain    Currently in Pain? No/denies                            Pelvic Floor  Special Questions - 07/12/20 0001     Fluid intake at least 16 oz 1-2/day; sprite or pepsi 12 oz;    Caffeine beverages tea oz               [facility] Adult PT Treatment/Exercise - 07/12/20 0001       Self-Care   Other Self-Care Comments  discussed other ways to get more liquids such as snack on watery fruit and vegetables      Exercises   Other Exercises  sitting on foam noodle      Manual Therapy   Soft tissue mobilization thoracic lumbar paraspinals              Trigger Point Dry Needling - 07/12/20 0001     Consent Given? Yes    Education Handout Provided Previously provided    Lumbar multifidi Response Twitch response elicited;Palpable increased muscle length                       PT Long Term Goals - 07/12/20 1621       PT LONG TERM GOAL #1   Title Pt will be able to have at least 4 BM/week  Baseline 3 last week    Status On-going      PT LONG TERM GOAL #2   Title Pt will be ind with advanced HEP      PT LONG TERM GOAL #3   Title pt will demonstrate improved lifestyle habits to reduce risk of future health complications    Status Achieved      PT LONG TERM GOAL #4   Title Pt will report at least 50% less abdominal pain                   Plan - 07/12/20 1654     Clinical Impression Statement Pt responed well to dry needling last session so did #2 treatment today.  Pt is doing great with the stretches and lifestyle changes.  She noticed a big improvement with walking a lot.  Pt will benefit from skilled PT to continue to address soft tissue spasms and monitor lifestyle changes to ensure they are manageable for improved bowel habits.    PT Treatment/Interventions ADLs/Self Care Home Management;Biofeedback;Cryotherapy;Electrical Stimulation;Moist Heat;Therapeutic activities;Therapeutic exercise;Neuromuscular re-education;Taping;Dry needling;Manual techniques;Patient/family education    PT Next Visit Plan f/u on eating more fruit/veg  and DN#2, thoracic stretches    PT Home Exercise Plan Access Code: [email or web address]    Consulted and Agree with Plan of Care Patient             Patient will benefit from skilled therapeutic intervention in order to improve the following deficits and impairments:  Pain, Postural dysfunction, Impaired flexibility, Increased fascial restricitons, Decreased strength, Decreased coordination, Impaired tone, Decreased endurance, Increased muscle spasms  Visit Diagnosis: Cramp and spasm  Other lack of coordination     Problem List Patient Active Problem List   Diagnosis Date Noted   Menorrhagia 12/13/2010   Fibroid, uterine 12/13/2010   Dysmenorrhea 12/13/2010    [name], PT 07/12/2020, 5:01 PM  [facility] [address] [place], [place], [address] Phone: [phone]   Fax:  [phone]  Name: [name] MRN: [id number] Date of Birth: 1970-08-25

## 2020-07-21 ENCOUNTER — Encounter: Payer: Self-pay | Admitting: Physical Therapy

## 2020-07-21 ENCOUNTER — Ambulatory Visit: Payer: Medicaid Other | Attending: Nurse Practitioner | Admitting: Physical Therapy

## 2020-07-21 ENCOUNTER — Other Ambulatory Visit: Payer: Self-pay

## 2020-07-21 DIAGNOSIS — R278 Other lack of coordination: Secondary | ICD-10-CM | POA: Insufficient documentation

## 2020-07-21 DIAGNOSIS — R252 Cramp and spasm: Secondary | ICD-10-CM | POA: Insufficient documentation

## 2020-07-21 NOTE — Therapy (Signed)
[facility] [address] [place], [place], [address] Phone: [phone]   Fax:  [phone]  Physical Therapy Treatment  Patient Details  Name: [name] MRN: [id number] Date of Birth: 07-Jul-1970 Referring Provider (PT): Vonna Drafts, FNP   Encounter Date: 07/21/2020   PT End of Session - 07/21/20 1502     Visit Number 5    Date for PT Re-Evaluation 08/17/20    Authorization Type [id number] O - CCME 3 to 7/13    Authorization - Visit Number 2    Authorization - Number of Visits 3    PT Start Time 1324    PT Stop Time 1530    PT Time Calculation (min) 41 min    Activity Tolerance Patient tolerated treatment well    Behavior During Therapy [facility] for tasks assessed/performed             Past Medical History:  Diagnosis Date   Anxiety    Depression    history - no current prob - no meds    Past Surgical History:  Procedure Laterality Date   CESAREAN SECTION     x 4   CYSTOSCOPY  12/13/2010   Procedure: CYSTOSCOPY;  Surgeon: Eldred Manges, MD;  Location: [place] [facility];  Service: Gynecology;;   ESOPHAGOGASTRODUODENOSCOPY (EGD) WITH PROPOFOL N/A 04/26/2020   Procedure: ESOPHAGOGASTRODUODENOSCOPY (EGD) WITH PROPOFOL;  Surgeon: Arta Silence, MD;  [facility]: [facility];  Service: Endoscopy;  Laterality: N/A;   EYE MUSCLE SURGERY     as child - right eye   HYSTERECTOMY ABDOMINAL WITH SALPINGECTOMY  11/2010   TUBAL LIGATION     UPPER ESOPHAGEAL ENDOSCOPIC ULTRASOUND (EUS) N/A 04/26/2020   Procedure: UPPER ESOPHAGEAL ENDOSCOPIC ULTRASOUND (EUS) Linear;  Surgeon: Arta Silence, MD;  Location: [facility];  Service: Endoscopy;  Laterality: N/A;    There were no vitals filed for this visit.   Subjective Assessment - 07/21/20 1409     Subjective Pt had one week without BM and this week was 3x.  I had abdominal pain badly when I didn't go that week but overall better because I am going every other week.     Patient Stated Goals have regular BMs and no abdominal pain    Currently in Pain? No/denies                               [facility] Adult PT Treatment/Exercise - 07/21/20 0001       Lumbar Exercises: Stretches   Other Lumbar Stretch Exercise deep squat with breathing      Lumbar Exercises: Supine   Other Supine Lumbar Exercises thoracic rotation and hip rotation stretches - 10 x 5 sec      Manual Therapy   Soft tissue mobilization thoracic lumbar paraspinals, Lt upper trap                         PT Long Term Goals - 07/21/20 1523       PT LONG TERM GOAL #1   Title Pt will be able to have at least 4 BM/week    Baseline 3 last week    Status On-going      PT LONG TERM GOAL #2   Title Pt will be ind with advanced HEP    Status On-going      PT LONG TERM GOAL #3   Title pt will demonstrate improved  lifestyle habits to reduce risk of future health complications    Baseline more water and is taking walks    Status Achieved      PT LONG TERM GOAL #4   Title Pt will report at least 50% less abdominal pain    Status On-going                   Plan - 07/21/20 1516     Clinical Impression Statement Today's session focused on spine and abominal mobility of vertebrea and soft tissues including fasia.  Pt was tight throughout thoracic and lumbar spine.  Pt responded well to [facility] and myofascial release techniques.  Stretches were provided to encourage more mobility throughout these regions for continued improvement of bowel habits.    PT Treatment/Interventions ADLs/Self Care Home Management;Biofeedback;Cryotherapy;Electrical Stimulation;Moist Heat;Therapeutic activities;Therapeutic exercise;Neuromuscular re-education;Taping;Dry needling;Manual techniques;Patient/family education    PT Next Visit Plan DN thoracic and upper trap and lumbar spine    PT Home Exercise Plan Access Code: [email or web address]    Consulted and Agree with Plan of Care Patient              Patient will benefit from skilled therapeutic intervention in order to improve the following deficits and impairments:  Pain, Postural dysfunction, Impaired flexibility, Increased fascial restricitons, Decreased strength, Decreased coordination, Impaired tone, Decreased endurance, Increased muscle spasms  Visit Diagnosis: Cramp and spasm  Other lack of coordination     Problem List Patient Active Problem List   Diagnosis Date Noted   Menorrhagia 12/13/2010   Fibroid, uterine 12/13/2010   Dysmenorrhea 12/13/2010    [name], PT 07/21/2020, 3:26 PM  [facility] [address], [place], [place], [address] Phone: [phone]   Fax:  [phone]  Name: [name] MRN: [id number] Date of Birth: July 02, 1970

## 2020-07-25 ENCOUNTER — Ambulatory Visit: Payer: Medicaid Other | Admitting: Physical Therapy

## 2020-07-26 ENCOUNTER — Other Ambulatory Visit: Payer: Self-pay

## 2020-07-26 ENCOUNTER — Encounter: Payer: Self-pay | Admitting: Physical Therapy

## 2020-07-26 ENCOUNTER — Ambulatory Visit: Payer: Medicaid Other | Admitting: Physical Therapy

## 2020-07-26 DIAGNOSIS — R278 Other lack of coordination: Secondary | ICD-10-CM

## 2020-07-26 DIAGNOSIS — R252 Cramp and spasm: Secondary | ICD-10-CM | POA: Diagnosis not present

## 2020-07-26 NOTE — Patient Instructions (Signed)
Access Code: [email or web address] URL: [email or web address] Date: 07/26/2020 Prepared by: Jari Favre  Exercises Supine Pelvic Floor Contraction - 3 x daily - 7 x weekly - 1 sets - 10 reps - 3 sec hold Seated Kegel - can do while driving - 1 x daily - 7 x weekly - 1 sets - 10 reps Seated Diaphragmatic Breathing - 3 x daily - 7 x weekly - 1 sets - 10 reps Supine Butterfly Groin Stretch - 1 x daily - 7 x weekly - 3 reps - 1 sets - 30 sec hold Supine Hip Internal and External Rotation - 1 x daily - 7 x weekly - 10 reps - 1 sets - 5 sec hold Supine Figure 4 Piriformis Stretch - 1 x daily - 7 x weekly - 3 reps - 1 sets - 30 sec hold Sidelying Thoracic and Shoulder Rotation - 1 x daily - 7 x weekly - 1 sets - 10 reps - 5 sec hold Supine Breathing with Hands on Ribcage - 1 x daily - 7 x weekly - 3 sets - 10 reps  Patient Education Trigger Point Dry Needling

## 2020-07-26 NOTE — Therapy (Signed)
[facility] [address] [place], [place], [address] Phone: [phone]   Fax:  [phone]  Physical Therapy Treatment  Patient Details  Name: [name] MRN: [id number] Date of Birth: June 26, 1970 Referring Provider (PT): Vonna Drafts, FNP   Encounter Date: 07/26/2020   PT End of Session - 07/26/20 0806     Visit Number 6    Date for PT Re-Evaluation 09/06/20    Authorization Type [id number] O - CCME resubmitted toady    Authorization - Visit Number 3    Authorization - Number of Visits 3    PT Start Time 0806    PT Stop Time 0844    PT Time Calculation (min) 38 min    Activity Tolerance Patient tolerated treatment well    Behavior During Therapy [facility] for tasks assessed/performed             Past Medical History:  Diagnosis Date   Anxiety    Depression    history - no current prob - no meds    Past Surgical History:  Procedure Laterality Date   CESAREAN SECTION     x 4   CYSTOSCOPY  12/13/2010   Procedure: CYSTOSCOPY;  Surgeon: Eldred Manges, MD;  Location: [facility];  Service: Gynecology;;   ESOPHAGOGASTRODUODENOSCOPY (EGD) WITH PROPOFOL N/A 04/26/2020   Procedure: ESOPHAGOGASTRODUODENOSCOPY (EGD) WITH PROPOFOL;  Surgeon: Arta Silence, MD;  Location: [facility];  Service: Endoscopy;  Laterality: N/A;   EYE MUSCLE SURGERY     as child - right eye   HYSTERECTOMY ABDOMINAL WITH SALPINGECTOMY  11/2010   TUBAL LIGATION     UPPER ESOPHAGEAL ENDOSCOPIC ULTRASOUND (EUS) N/A 04/26/2020   Procedure: UPPER ESOPHAGEAL ENDOSCOPIC ULTRASOUND (EUS) Linear;  Surgeon: Arta Silence, MD;  Location: [facility];  Service: Endoscopy;  Laterality: N/A;    There were no vitals filed for this visit.   Subjective Assessment - 07/26/20 0808     Subjective Abdominal pain is the same.  I have been going a little more.  Up from 1x/ week or less.  I am going a little more than I have been and that seems to be  about 1.5 complete BMs every other week.    Patient Stated Goals have regular BMs and no abdominal pain                            Pelvic Floor Special Questions - 07/26/20 0001     External Palpation able to bulge with valsalva    Strength good squeeze, good lift, able to hold agaisnt strong resistance    Strength # of seconds 3    Tone moderate               [facility] Adult PT Treatment/Exercise - 07/26/20 0001       Self-Care   Other Self-Care Comments  abdominal massage      Neuro Re-ed    Neuro Re-ed Details  ribcage breathing with hand on ribcage;      Manual Therapy   Manual Therapy Myofascial release    Soft tissue mobilization thoracic lumbar paraspinals, Lt upper trap    Myofascial Release coccyx mobs in sitting and colon supine              Trigger Point Dry Needling - 07/26/20 0001     Consent Given? Yes    Education Handout Provided Previously provided    Muscles Treated Back/Hip  Thoracic multifidi;Lumbar multifidi    Lumbar multifidi Response Twitch response elicited;Palpable increased muscle length    Thoracic multifidi response Twitch response elicited;Palpable increased muscle length                  PT Education - 07/26/20 0850     Education Details Access Code: [email or web address]    Person(s) Educated Patient    Methods Explanation;Demonstration;Tactile cues;Verbal cues;Handout    Comprehension Verbalized understanding;Returned demonstration                 PT Long Term Goals - 07/26/20 0855       PT LONG TERM GOAL #1   Title Pt will be able to have at least 4 BM/week    Baseline 1.5 since she was here last week (5 days ago)    Time 6    Period Weeks    Status On-going    Target Date 09/06/20      PT LONG TERM GOAL #2   Title Pt will be ind with advanced HEP    Baseline still learning    Time 6    Period Weeks    Status On-going    Target Date 09/06/20      PT LONG TERM GOAL #3   Title pt will  demonstrate improved lifestyle habits to reduce risk of future health complications    Baseline more water and is taking walks    Time 6    Period Weeks    Status Achieved    Target Date 09/06/20      PT LONG TERM GOAL #4   Title Pt will report at least 50% less abdominal pain    Baseline same    Time 6    Period Weeks    Status New    Target Date 09/06/20                   Plan - 07/26/20 0851     Clinical Impression Statement Pt has made progress with being up to 1.5 BMs per week.  She has had less than 1/week prior to PT.  She has been this way for many years and is expected to make progress more slowly due to the time that she has had these bowel habits.  Pt is responding well to dry needling with increased soft tissue. Pt needs to continue to work on correct breathing technique.  She needed a lot of cues and could not get the ribcage to collapse every time when exhaling.  Pt was also instructed on abdominal massage which she was given at the eval but had forgotten due to a lot of information all at one time.  Pt will benefit from continued skilled PT so her BM patterns improve and reduce other health related risks associated with that.    PT Frequency 1x / week    PT Duration 6 weeks    PT Treatment/Interventions ADLs/Self Care Home Management;Biofeedback;Cryotherapy;Electrical Stimulation;Moist Heat;Therapeutic activities;Therapeutic exercise;Neuromuscular re-education;Taping;Dry needling;Manual techniques;Patient/family education    PT Next Visit Plan DN thoracic and upper trap and lumbar spine, diaphragm breathing with TrA activation    PT Home Exercise Plan Access Code: [email or web address]    Consulted and Agree with Plan of Care Patient             Patient will benefit from skilled therapeutic intervention in order to improve the following deficits and impairments:  Pain, Postural dysfunction, Impaired flexibility, Increased fascial restricitons, Decreased strength, Decreased  coordination, Impaired tone,  Decreased endurance, Increased muscle spasms  Visit Diagnosis: Cramp and spasm - Plan: PT plan of care cert/re-cert  Other lack of coordination - Plan: PT plan of care cert/re-cert     Problem List Patient Active Problem List   Diagnosis Date Noted   Menorrhagia 12/13/2010   Fibroid, uterine 12/13/2010   Dysmenorrhea 12/13/2010    [name], PT 07/26/2020, 9:06 AM   [facility] [address] [place], [place], [address] Phone: [phone]   Fax:  [phone]  Name: [name] MRN: [id number] Date of Birth: 09/10/1970

## 2020-07-28 ENCOUNTER — Encounter: Payer: Medicaid Other | Admitting: Physical Therapy

## 2020-08-03 ENCOUNTER — Encounter: Payer: Medicaid Other | Admitting: Physical Therapy

## 2020-08-05 ENCOUNTER — Other Ambulatory Visit: Payer: Self-pay

## 2020-08-05 ENCOUNTER — Encounter: Payer: Self-pay | Admitting: Physical Therapy

## 2020-08-05 ENCOUNTER — Ambulatory Visit: Payer: Medicaid Other | Admitting: Physical Therapy

## 2020-08-05 DIAGNOSIS — R278 Other lack of coordination: Secondary | ICD-10-CM

## 2020-08-05 DIAGNOSIS — R252 Cramp and spasm: Secondary | ICD-10-CM | POA: Diagnosis not present

## 2020-08-05 NOTE — Therapy (Signed)
[facility] [address], [place], [place], [address] Phone: [phone]   Fax:  [phone]  Physical Therapy Treatment  Patient Details  Name: [name] MRN: [id number] Date of Birth: 06/03/1970 Referring Provider (PT): Vonna Drafts, FNP   Encounter Date: 08/05/2020   PT End of Session - 08/05/20 1027     Visit Number 7    Date for PT Re-Evaluation 09/06/20    Authorization Type [id number] O - CCME 6 visits to 8/28    Authorization - Visit Number 1    Authorization - Number of Visits 6    PT Start Time 1022    PT Stop Time 1100    PT Time Calculation (min) 38 min    Activity Tolerance Patient tolerated treatment well    Behavior During Therapy [facility] for tasks assessed/performed             Past Medical History:  Diagnosis Date   Anxiety    Depression    history - no current prob - no meds    Past Surgical History:  Procedure Laterality Date   CESAREAN SECTION     x 4   CYSTOSCOPY  12/13/2010   Procedure: CYSTOSCOPY;  Surgeon: Eldred Manges, MD;  Location: [facility];  Service: Gynecology;;   ESOPHAGOGASTRODUODENOSCOPY (EGD) WITH PROPOFOL N/A 04/26/2020   Procedure: ESOPHAGOGASTRODUODENOSCOPY (EGD) WITH PROPOFOL;  Surgeon: Arta Silence, MD;  Location: [facility];  Service: Endoscopy;  Laterality: N/A;   EYE MUSCLE SURGERY     as child - right eye   HYSTERECTOMY ABDOMINAL WITH SALPINGECTOMY  11/2010   TUBAL LIGATION     UPPER ESOPHAGEAL ENDOSCOPIC ULTRASOUND (EUS) N/A 04/26/2020   Procedure: UPPER ESOPHAGEAL ENDOSCOPIC ULTRASOUND (EUS) Linear;  Surgeon: Arta Silence, MD;  Location: [facility];  Service: Endoscopy;  Laterality: N/A;    There were no vitals filed for this visit.   Subjective Assessment - 08/05/20 1028     Subjective No BMs other than last Tuesday    Patient Stated Goals have regular BMs and no abdominal pain    Currently in Pain? No/denies                                [facility] Adult PT Treatment/Exercise - 08/05/20 0001       Self-Care   Other Self-Care Comments  abdominal massage, perineal massage      Lumbar Exercises: Stretches   Quad Stretch Right;Left;30 seconds      Manual Therapy   Manual Therapy Soft tissue mobilization;Internal Pelvic Floor    Manual therapy comments pt identity confirmed and infomed consent to do internal soft tissue provided; skilled Palpation to soft tissue and bony landmarks provided during dry needling    Soft tissue mobilization lumbar STM              Trigger Point Dry Needling - 08/05/20 0001     Consent Given? Yes    Education Handout Provided Previously provided    Lumbar multifidi Response Twitch response elicited;Palpable increased muscle length                  PT Education - 08/05/20 1152     Education Details abdominal massage, stretching anal sphincters in shower    Person(s) Educated Patient    Methods Explanation;Demonstration;Verbal cues;Handout;Tactile cues    Comprehension Verbalized understanding;Returned demonstration  PT Long Term Goals - 08/05/20 1153       PT LONG TERM GOAL #1   Title Pt will be able to have at least 4 BM/week    Status On-going                   Plan - 08/05/20 1155     Clinical Impression Statement Today focused on release of pelvic floor and continue dry needling.  Pt continues to get some tension throughout.  Review of abdominal massage and inroduction to perineal massage and stretch of anal sphincter muscles was educated and demo given.  Pt will benefit from skilled PT to continue to address soft tissue and coordinatin impairments.    PT Treatment/Interventions ADLs/Self Care Home Management;Biofeedback;Cryotherapy;Electrical Stimulation;Moist Heat;Therapeutic activities;Therapeutic exercise;Neuromuscular re-education;Taping;Dry needling;Manual techniques;Patient/family education     PT Next Visit Plan DN thoracic and upper trap and lumbar spine, diaphragm breathing with TrA activation; internal STM and biofeedback as needed    PT Home Exercise Plan Access Code: [email or web address]    Consulted and Agree with Plan of Care Patient             Patient will benefit from skilled therapeutic intervention in order to improve the following deficits and impairments:  Pain, Postural dysfunction, Impaired flexibility, Increased fascial restricitons, Decreased strength, Decreased coordination, Impaired tone, Decreased endurance, Increased muscle spasms  Visit Diagnosis: Cramp and spasm  Other lack of coordination     Problem List Patient Active Problem List   Diagnosis Date Noted   Menorrhagia 12/13/2010   Fibroid, uterine 12/13/2010   Dysmenorrhea 12/13/2010    [name], PT 08/05/2020, 12:01 PM  [facility] [address], [place], [place], [address] Phone: [phone]   Fax:  [phone]  Name: [name] MRN: [id number] Date of Birth: June 18, 1970

## 2020-08-05 NOTE — Patient Instructions (Signed)
About Abdominal Massage  Abdominal massage, also called external colon massage, is a self-treatment circular massage technique that can reduce and eliminate gas and ease constipation. The colon naturally contracts in waves in a clockwise direction starting from inside the right hip, moving up toward the ribs, across the belly, and down inside the left hip.  When you perform circular abdominal massage, you help stimulate your colon's normal wave pattern of movement called peristalsis.  It is most beneficial when done after eating.  Positioning You can practice abdominal massage with oil while lying down, or in the shower with soap.  Some people find that it is just as effective to do the massage through clothing while sitting or standing.  How to Massage Start by placing your finger tips or knuckles on your right side, just inside your hip bone.  . Make small circular movements while you move upward toward your rib cage.   . Once you reach the bottom right side of your rib cage, take your circular movements across to the left side of the bottom of your rib cage.  . Next, move downward until you reach the inside of your left hip bone.  This is the path your feces travel in your colon. . Continue to perform your abdominal massage in this pattern for 10 minutes each day.     You can apply as much pressure as is comfortable in your massage.  Start gently and build pressure as you continue to practice.  Notice any areas of pain as you massage; areas of slight pain may be relieved as you massage, but if you have areas of significant or intense pain, consult with your healthcare provider.  Other Considerations . General physical activity including bending and stretching can have a beneficial massage-like effect on the colon.  Deep breathing can also stimulate the colon because breathing deeply activates the same nervous system that supplies the colon.   . Abdominal massage should always be used in  combination with a bowel-conscious diet that is high in the proper type of fiber for you, fluids (primarily water), and a regular exercise program.  

## 2020-08-11 ENCOUNTER — Ambulatory Visit: Payer: Medicaid Other | Admitting: Physical Therapy

## 2020-08-11 ENCOUNTER — Other Ambulatory Visit: Payer: Self-pay

## 2020-08-11 DIAGNOSIS — R278 Other lack of coordination: Secondary | ICD-10-CM

## 2020-08-11 DIAGNOSIS — R252 Cramp and spasm: Secondary | ICD-10-CM

## 2020-08-11 NOTE — Therapy (Signed)
[facility] [address] [place], [place], [address] Phone: [phone]   Fax:  [phone]  Physical Therapy Treatment  Patient Details  Name: [name] MRN: [id number] Date of Birth: August 12, 1970 Referring Provider (PT): Vonna Drafts, FNP   Encounter Date: 08/11/2020   PT End of Session - 08/11/20 1405     Visit Number 8    Date for PT Re-Evaluation 09/06/20    Authorization Type [id number] O - CCME 6 visits to 8/28    Authorization - Visit Number 2    Authorization - Number of Visits 6    PT Start Time [id number]    PT Stop Time 1441    PT Time Calculation (min) 37 min    Activity Tolerance Patient tolerated treatment well    Behavior During Therapy [facility] for tasks assessed/performed             Past Medical History:  Diagnosis Date   Anxiety    Depression    history - no current prob - no meds    Past Surgical History:  Procedure Laterality Date   CESAREAN SECTION     x 4   CYSTOSCOPY  12/13/2010   Procedure: CYSTOSCOPY;  Surgeon: Eldred Manges, MD;  Location: [facility];  Service: Gynecology;;   ESOPHAGOGASTRODUODENOSCOPY (EGD) WITH PROPOFOL N/A 04/26/2020   Procedure: ESOPHAGOGASTRODUODENOSCOPY (EGD) WITH PROPOFOL;  Surgeon: Arta Silence, MD;  Location: [facility];  Service: Endoscopy;  Laterality: N/A;   EYE MUSCLE SURGERY     as child - right eye   HYSTERECTOMY ABDOMINAL WITH SALPINGECTOMY  11/2010   TUBAL LIGATION     UPPER ESOPHAGEAL ENDOSCOPIC ULTRASOUND (EUS) N/A 04/26/2020   Procedure: UPPER ESOPHAGEAL ENDOSCOPIC ULTRASOUND (EUS) Linear;  Surgeon: Arta Silence, MD;  Location: [facility];  Service: Endoscopy;  Laterality: N/A;    There were no vitals filed for this visit.   Subjective Assessment - 08/11/20 1504     Subjective I had 2 BMs this week    Patient Stated Goals have regular BMs and no abdominal pain    Currently in Pain? No/denies                                [facility] Adult PT Treatment/Exercise - 08/11/20 0001       Manual Therapy   Manual Therapy Soft tissue mobilization;Internal Pelvic Floor    Manual therapy comments pt identity confirmed and infomed consent to do internal soft tissue provided; skilled Palpation to soft tissue and bony landmarks provided during dry needling    Soft tissue mobilization lumbar STM, upper trap    Internal Pelvic Floor levators, puborectalis, and coccyx mobs              Trigger Point Dry Needling - 08/11/20 0001     Consent Given? Yes    Education Handout Provided Previously provided    Muscles Treated Head and Neck Upper trapezius    Dry Needling Comments bil    Upper Trapezius Response Twitch reponse elicited;Palpable increased muscle length    Lumbar multifidi Response Twitch response elicited;Palpable increased muscle length    Thoracic multifidi response Twitch response elicited;Palpable increased muscle length                       PT Long Term Goals - 08/11/20 1446       PT LONG TERM GOAL #1  Title Pt will be able to have at least 4 BM/week    Baseline 2 last week    Status On-going      PT LONG TERM GOAL #2   Title Pt will be ind with advanced HEP    Status On-going      PT LONG TERM GOAL #4   Title Pt will report at least 50% less abdominal pain    Status On-going                   Plan - 08/11/20 1441     Clinical Impression Statement Pt had better week last week so we continued with working on improved soft tissue length.  Pt was very tight thoughout upper traps and thoracic spine.  Pt had good response from dry needling and STM.  PT also reviewed anal sphincter release with wand to continue to address pelvic tension at home.    PT Treatment/Interventions ADLs/Self Care Home Management;Biofeedback;Cryotherapy;Electrical Stimulation;Moist Heat;Therapeutic activities;Therapeutic exercise;Neuromuscular  re-education;Taping;Dry needling;Manual techniques;Patient/family education    PT Next Visit Plan DN thoracic and upper trap and lumbar spine, diaphragm breathing with TrA activation; internal STM #3 and biofeedback as needed    PT Home Exercise Plan Access Code: [email or web address]    Consulted and Agree with Plan of Care Patient             Patient will benefit from skilled therapeutic intervention in order to improve the following deficits and impairments:  Pain, Postural dysfunction, Impaired flexibility, Increased fascial restricitons, Decreased strength, Decreased coordination, Impaired tone, Decreased endurance, Increased muscle spasms  Visit Diagnosis: Cramp and spasm  Other lack of coordination     Problem List Patient Active Problem List   Diagnosis Date Noted   Menorrhagia 12/13/2010   Fibroid, uterine 12/13/2010   Dysmenorrhea 12/13/2010    [name], PT 08/11/2020, 3:05 PM  [facility] [address], [place], [place], [address] Phone: [phone]   Fax:  [phone]  Name: [name] MRN: [id number] Date of Birth: 07-15-1970

## 2020-08-18 ENCOUNTER — Other Ambulatory Visit: Payer: Self-pay

## 2020-08-18 ENCOUNTER — Ambulatory Visit: Payer: Medicaid Other | Attending: Nurse Practitioner | Admitting: Physical Therapy

## 2020-08-18 DIAGNOSIS — R278 Other lack of coordination: Secondary | ICD-10-CM | POA: Insufficient documentation

## 2020-08-18 DIAGNOSIS — R252 Cramp and spasm: Secondary | ICD-10-CM | POA: Insufficient documentation

## 2020-08-18 NOTE — Therapy (Signed)
[facility] [address], [place], [place], [address] Phone: [phone]   Fax:  [phone]  Physical Therapy Treatment  Patient Details  Name: [name] MRN: [id number] Date of Birth: 1970/04/14 Referring Provider (PT): Vonna Drafts, FNP   Encounter Date: 08/18/2020   PT End of Session - 08/18/20 1001     Visit Number 9    Date for PT Re-Evaluation 09/06/20    Authorization Type [id number] O - CCME 6 visits to 8/28    Authorization - Visit Number 3    Authorization - Number of Visits 6    PT Start Time 0933    PT Stop Time 1008    PT Time Calculation (min) 35 min    Activity Tolerance Patient tolerated treatment well    Behavior During Therapy [facility] for tasks assessed/performed             Past Medical History:  Diagnosis Date   Anxiety    Depression    history - no current prob - no meds    Past Surgical History:  Procedure Laterality Date   CESAREAN SECTION     x 4   CYSTOSCOPY  12/13/2010   Procedure: CYSTOSCOPY;  Surgeon: Eldred Manges, MD;  Location: [facility];  Service: Gynecology;;   ESOPHAGOGASTRODUODENOSCOPY (EGD) WITH PROPOFOL N/A 04/26/2020   Procedure: ESOPHAGOGASTRODUODENOSCOPY (EGD) WITH PROPOFOL;  Surgeon: Arta Silence, MD;  Location: [facility];  Service: Endoscopy;  Laterality: N/A;   EYE MUSCLE SURGERY     as child - right eye   HYSTERECTOMY ABDOMINAL WITH SALPINGECTOMY  11/2010   TUBAL LIGATION     UPPER ESOPHAGEAL ENDOSCOPIC ULTRASOUND (EUS) N/A 04/26/2020   Procedure: UPPER ESOPHAGEAL ENDOSCOPIC ULTRASOUND (EUS) Linear;  Surgeon: Arta Silence, MD;  Location: [facility];  Service: Endoscopy;  Laterality: N/A;    There were no vitals filed for this visit.   Subjective Assessment - 08/18/20 1005     Subjective I had 2 BMs this week again    Patient Stated Goals have regular BMs and no abdominal pain    Currently in Pain? No/denies                                [facility] Adult PT Treatment/Exercise - 08/18/20 0001       Lumbar Exercises: Supine   Other Supine Lumbar Exercises lying on foam roll to release spine with MELT method; overhead reaches and bent knee fall out with core engaged      Manual Therapy   Manual therapy comments pt identity confirmed and infomed consent to do internal soft tissue provided; skilled Palpation to soft tissue and bony landmarks provided during dry needling    Soft tissue mobilization lumbar and thoracic STM and MFR    Internal Pelvic Floor levators, puborectalis, and coccyx mobs              Trigger Point Dry Needling - 08/18/20 0001     Consent Given? Yes    Lumbar multifidi Response Twitch response elicited;Palpable increased muscle length    Thoracic multifidi response Twitch response elicited;Palpable increased muscle length                       PT Long Term Goals - 08/18/20 1004       PT LONG TERM GOAL #1   Title Pt will be able to have at least  4 BM/week    Baseline 2 last week    Status On-going      PT LONG TERM GOAL #2   Title Pt will be ind with advanced HEP    Status On-going      PT LONG TERM GOAL #3   Title pt will demonstrate improved lifestyle habits to reduce risk of future health complications    Status Achieved      PT LONG TERM GOAL #4   Title Pt will report at least 50% less abdominal pain    Baseline less this week    Status On-going                   Plan - 08/18/20 0937     Clinical Impression Statement Pt had 2 BMs this week so has maintained progress, but still working towards improved muscle length for reduced muscle spasms in pelvic floor. Pt is responding well and had less tension noticeable in the pelvic floor.  lumbar erectors released with dry needling and STM treatment.    Comorbidities c-sectionx4; hysterectomy; history of tubaligation and fibroid uterine    PT Treatment/Interventions ADLs/Self Care  Home Management;Biofeedback;Cryotherapy;Electrical Stimulation;Moist Heat;Therapeutic activities;Therapeutic exercise;Neuromuscular re-education;Taping;Dry needling;Manual techniques;Patient/family education    PT Next Visit Plan DN thoracic and upper trap and lumbar spine, diaphragm breathing with TrA activation; internal STM #4 and biofeedback as needed    PT Home Exercise Plan Access Code: [email or web address]    Consulted and Agree with Plan of Care Patient             Patient will benefit from skilled therapeutic intervention in order to improve the following deficits and impairments:  Pain, Postural dysfunction, Impaired flexibility, Increased fascial restricitons, Decreased strength, Decreased coordination, Impaired tone, Decreased endurance, Increased muscle spasms  Visit Diagnosis: Cramp and spasm  Other lack of coordination     Problem List Patient Active Problem List   Diagnosis Date Noted   Menorrhagia 12/13/2010   Fibroid, uterine 12/13/2010   Dysmenorrhea 12/13/2010    [name], PT 08/18/2020, 10:10 AM  [facility] [address] [place], [place], [address] Phone: [phone]   Fax:  [phone]  Name: [name] MRN: [id number] Date of Birth: 1970-11-16

## 2020-09-01 NOTE — Pre-Procedure Instructions (Signed)
Surgical Instructions    Your procedure is scheduled on Thursday September 22, 2020.   Report to [facility] Main Entrance "A" at 05:30 A.M., then check in with the Admitting office.  Call this number if you have problems the morning of surgery:  [phone]   If you have any questions prior to your surgery date call [phone]: Open Monday-Friday 8am-4pm    Remember:  Do not eat or drink after midnight the night before your surgery    Take these medicines the morning of surgery with A SIP OF WATER   docusate sodium (COLACE)- If needed  As of today, STOP taking any Aspirin (unless otherwise instructed by your surgeon) Aleve, Naproxen, Ibuprofen, Motrin, Advil, Goody's, BC's, all herbal medications, fish oil, and all vitamins.                     Do NOT Smoke (Tobacco/Vaping) or drink Alcohol 24 hours prior to your procedure.  If you use a CPAP at night, you may bring all equipment for your overnight stay.   Contacts, glasses, piercing's, hearing aid's, dentures or partials may not be worn into surgery, please bring cases for these belongings.    For patients admitted to the hospital, discharge time will be determined by your treatment team.   Patients discharged the day of surgery will not be allowed to drive home, and someone needs to stay with them for 24 hours.  ONLY 1 SUPPORT PERSON MAY BE PRESENT WHILE YOU ARE IN SURGERY. IF YOU ARE TO BE ADMITTED ONCE YOU ARE IN YOUR ROOM YOU WILL BE ALLOWED TWO (2) VISITORS.  Minor children may have two parents present. Special consideration for safety and communication needs will be reviewed on a case by case basis.   Special instructions:   [name]- Preparing For Surgery  Before surgery, you can play an important role. Because skin is not sterile, your skin needs to be as free of germs as possible. You can reduce the number of germs on your skin by washing with CHG (chlorahexidine gluconate) Soap before surgery.  CHG is an  antiseptic cleaner which kills germs and bonds with the skin to continue killing germs even after washing.    Oral Hygiene is also important to reduce your risk of infection.  Remember - BRUSH YOUR TEETH THE MORNING OF SURGERY WITH YOUR REGULAR TOOTHPASTE  Please do not use if you have an allergy to CHG or antibacterial soaps. If your skin becomes reddened/irritated stop using the CHG.  Do not shave (including legs and underarms) for at least 48 hours prior to first CHG shower. It is OK to shave your face.  Please follow these instructions carefully.   Shower the NIGHT BEFORE SURGERY and the MORNING OF SURGERY  If you chose to wash your hair, wash your hair first as usual with your normal shampoo.  After you shampoo, rinse your hair and body thoroughly to remove the shampoo.  Use CHG Soap as you would any other liquid soap. You can apply CHG directly to the skin and wash gently with a scrungie or a clean washcloth.   Apply the CHG Soap to your body ONLY FROM THE NECK DOWN.  Do not use on open wounds or open sores. Avoid contact with your eyes, ears, mouth and genitals (private parts). Wash Face and genitals (private parts)  with your normal soap.   Wash thoroughly, paying special attention to the area where your surgery will be performed.  Thoroughly  rinse your body with warm water from the neck down.  DO NOT shower/wash with your normal soap after using and rinsing off the CHG Soap.  Pat yourself dry with a CLEAN TOWEL.  Wear CLEAN PAJAMAS to bed the night before surgery  Place CLEAN SHEETS on your bed the night before your surgery  DO NOT SLEEP WITH PETS.   Day of Surgery: Shower with CHG soap. Do not wear jewelry, make up, nail polish, gel polish, artificial nails, or any other type of covering on natural nails including finger and toenails. If patients have artificial nails, gel coating, etc. that need to be removed by a nail salon please have this removed prior to surgery.  Surgery may need to be canceled/delayed if the surgeon/ anesthesia feels like the patient is unable to be adequately monitored. Do not wear lotions, powders, perfumes/colognes, or deodorant. Do not shave 48 hours prior to surgery.  Men may shave face and neck. Do not bring valuables to the hospital. [facility] is not responsible for any belongings or valuables. Wear Clean/Comfortable clothing the morning of surgery Remember to brush your teeth WITH YOUR REGULAR TOOTHPASTE.   Please read over the following fact sheets that you were given.

## 2020-09-02 ENCOUNTER — Encounter (HOSPITAL_COMMUNITY)
Admission: RE | Admit: 2020-09-02 | Discharge: 2020-09-02 | Disposition: A | Discharge status: Home / Self Care | Payer: Medicaid Other | Source: Ambulatory Visit | Attending: Surgery | Admitting: Surgery

## 2020-09-02 ENCOUNTER — Encounter (HOSPITAL_COMMUNITY): Payer: Self-pay

## 2020-09-02 ENCOUNTER — Other Ambulatory Visit: Payer: Self-pay

## 2020-09-02 DIAGNOSIS — Z01812 Encounter for preprocedural laboratory examination: Secondary | ICD-10-CM | POA: Diagnosis not present

## 2020-09-02 LAB — CBC
HCT: 45 % (ref 36.0–46.0)
Hemoglobin: 14.7 g/dL (ref 12.0–15.0)
MCH: 32.3 pg (ref 26.0–34.0)
MCHC: 32.7 g/dL (ref 30.0–36.0)
MCV: 98.9 fL (ref 80.0–100.0)
Platelets: 220 10*3/uL (ref 150–400)
RBC: 4.55 MIL/uL (ref 3.87–5.11)
RDW: 12.9 % (ref 11.5–15.5)
WBC: 5 10*3/uL (ref 4.0–10.5)
nRBC: 0 % (ref 0.0–0.2)

## 2020-09-02 LAB — TYPE AND SCREEN
ABO/RH(D): O POS
Antibody Screen: NEGATIVE

## 2020-09-02 NOTE — Progress Notes (Signed)
PCP - Selina Cooley, FNP Cardiologist - denies  PPM/ICD - n/a Device Orders - n/a Rep Notified - n/a  Chest x-ray - n/a EKG - n/a Stress Test - denies ECHO - denies Cardiac Cath - denies  Sleep Study - denies CPAP - denies  Fasting Blood Sugar - n/a Checks Blood Sugar  n/a times a day  Blood Thinner Instructions: n/a Aspirin Instructions: n/a  ERAS Protcol - NPO  PRE-SURGERY Ensure or G2- n/a  COVID TEST- Patient is aware to go for COVID testing on Tuesday 09/20/20 between 8 am and 3 pm. Requisition form filled out and given to patient. Patient verbalized understanding.    Anesthesia review: No  Patient denies shortness of breath, fever, cough and chest pain at PAT appointment   All instructions explained to the patient, with a verbal understanding of the material. Patient agrees to go over the instructions while at home for a better understanding. Patient also instructed to self quarantine after being tested for COVID-19. The opportunity to ask questions was provided.

## 2020-09-21 ENCOUNTER — Other Ambulatory Visit: Payer: Self-pay | Admitting: Surgery

## 2020-09-21 LAB — SARS CORONAVIRUS 2 (TAT 6-24 HRS): SARS Coronavirus 2: NEGATIVE

## 2020-09-21 NOTE — Anesthesia Preprocedure Evaluation (Addendum)
Anesthesia Evaluation  Patient identified by MRN, date of birth, ID band Patient awake    Reviewed: Allergy & Precautions, NPO status , Patient's Chart, lab work & pertinent test results  Airway Mallampati: III  TM Distance: >3 FB Neck ROM: Full  Mouth opening: Limited Mouth Opening  Dental  (+) Teeth Intact, Dental Advisory Given, Chipped,    Pulmonary neg pulmonary ROS,    Pulmonary exam normal breath sounds clear to auscultation       Cardiovascular negative cardio ROS Normal cardiovascular exam Rhythm:Regular Rate:Normal     Neuro/Psych PSYCHIATRIC DISORDERS Anxiety Depression negative neurological ROS     GI/Hepatic negative GI ROS, Neg liver ROS,   Endo/Other  negative endocrine ROS  Renal/GU negative Renal ROS  negative genitourinary   Musculoskeletal negative musculoskeletal ROS (+)   Abdominal   Peds  Hematology negative hematology ROS (+)   Anesthesia Other Findings Pancreatic cyst  Reproductive/Obstetrics                            Anesthesia Physical Anesthesia Plan  ASA: 2  Anesthesia Plan: General and Regional   Post-op Pain Management:  Regional for Post-op pain   Induction: Intravenous  PONV Risk Score and Plan: 3 and Midazolam, Dexamethasone and Ondansetron  Airway Management Planned: Oral ETT  Additional Equipment: Arterial line  Intra-op Plan:   Post-operative Plan: Extubation in OR  Informed Consent: I have reviewed the patients History and Physical, chart, labs and discussed the procedure including the risks, benefits and alternatives for the proposed anesthesia with the patient or authorized representative who has indicated his/her understanding and acceptance.     Dental advisory given  Plan Discussed with: CRNA  Anesthesia Plan Comments: (2 IVs)       Anesthesia Quick Evaluation

## 2020-09-22 ENCOUNTER — Encounter (HOSPITAL_COMMUNITY): Payer: Self-pay | Admitting: Surgery

## 2020-09-22 ENCOUNTER — Encounter (HOSPITAL_COMMUNITY)
Admission: RE | Disposition: A | Discharge status: Home / Self Care | Payer: Self-pay | Source: Home / Self Care | Attending: Surgery

## 2020-09-22 ENCOUNTER — Inpatient Hospital Stay (HOSPITAL_COMMUNITY): Payer: Medicaid Other

## 2020-09-22 ENCOUNTER — Other Ambulatory Visit: Payer: Self-pay

## 2020-09-22 ENCOUNTER — Inpatient Hospital Stay (HOSPITAL_COMMUNITY)
Admission: RE | Admit: 2020-09-22 | Discharge: 2020-09-27 | DRG: 406 | Disposition: A | Discharge status: Home / Self Care | Payer: Medicaid Other | Attending: Surgery | Admitting: Surgery

## 2020-09-22 DIAGNOSIS — Z23 Encounter for immunization: Secondary | ICD-10-CM | POA: Diagnosis not present

## 2020-09-22 DIAGNOSIS — D62 Acute posthemorrhagic anemia: Secondary | ICD-10-CM | POA: Diagnosis not present

## 2020-09-22 DIAGNOSIS — Z833 Family history of diabetes mellitus: Secondary | ICD-10-CM

## 2020-09-22 DIAGNOSIS — K3189 Other diseases of stomach and duodenum: Secondary | ICD-10-CM | POA: Diagnosis present

## 2020-09-22 DIAGNOSIS — Z8249 Family history of ischemic heart disease and other diseases of the circulatory system: Secondary | ICD-10-CM

## 2020-09-22 DIAGNOSIS — K8020 Calculus of gallbladder without cholecystitis without obstruction: Secondary | ICD-10-CM | POA: Diagnosis present

## 2020-09-22 DIAGNOSIS — Z79899 Other long term (current) drug therapy: Secondary | ICD-10-CM

## 2020-09-22 DIAGNOSIS — K660 Peritoneal adhesions (postprocedural) (postinfection): Secondary | ICD-10-CM | POA: Diagnosis present

## 2020-09-22 DIAGNOSIS — Z419 Encounter for procedure for purposes other than remedying health state, unspecified: Secondary | ICD-10-CM

## 2020-09-22 DIAGNOSIS — K862 Cyst of pancreas: Principal | ICD-10-CM | POA: Diagnosis present

## 2020-09-22 DIAGNOSIS — R42 Dizziness and giddiness: Secondary | ICD-10-CM | POA: Diagnosis not present

## 2020-09-22 HISTORY — PX: PANCREATECTOMY: SHX5261

## 2020-09-22 HISTORY — PX: LAPAROSCOPIC SPLENECTOMY: SHX409

## 2020-09-22 LAB — CBC
HCT: 36.6 % (ref 36.0–46.0)
Hemoglobin: 12.2 g/dL (ref 12.0–15.0)
MCH: 32.8 pg (ref 26.0–34.0)
MCHC: 33.3 g/dL (ref 30.0–36.0)
MCV: 98.4 fL (ref 80.0–100.0)
Platelets: 224 10*3/uL (ref 150–400)
RBC: 3.72 MIL/uL — ABNORMAL LOW (ref 3.87–5.11)
RDW: 12.9 % (ref 11.5–15.5)
WBC: 14 10*3/uL — ABNORMAL HIGH (ref 4.0–10.5)
nRBC: 0 % (ref 0.0–0.2)

## 2020-09-22 LAB — HEMOGLOBIN A1C
Hgb A1c MFr Bld: 5.1 % (ref 4.8–5.6)
Mean Plasma Glucose: 99.67 mg/dL

## 2020-09-22 LAB — BASIC METABOLIC PANEL
Anion gap: 8 (ref 5–15)
BUN: 8 mg/dL (ref 6–20)
CO2: 23 mmol/L (ref 22–32)
Calcium: 9.3 mg/dL (ref 8.9–10.3)
Chloride: 106 mmol/L (ref 98–111)
Creatinine, Ser: 0.67 mg/dL (ref 0.44–1.00)
GFR, Estimated: >60 mL/min (ref >60)
Glucose, Bld: 133 mg/dL — ABNORMAL HIGH (ref 70–99)
Potassium: 3.9 mmol/L (ref 3.5–5.1)
Sodium: 137 mmol/L (ref 135–145)

## 2020-09-22 LAB — GLUCOSE, CAPILLARY
Glucose-Capillary: 107 mg/dL — ABNORMAL HIGH (ref 70–99)
Glucose-Capillary: 109 mg/dL — ABNORMAL HIGH (ref 70–99)
Glucose-Capillary: 135 mg/dL — ABNORMAL HIGH (ref 70–99)
Glucose-Capillary: 141 mg/dL — ABNORMAL HIGH (ref 70–99)

## 2020-09-22 LAB — PREPARE RBC (CROSSMATCH)

## 2020-09-22 SURGERY — PANCREATECTOMY, LAPAROSCOPIC
Anesthesia: Regional | Site: Abdomen

## 2020-09-22 MED ORDER — FENTANYL CITRATE (PF) 100 MCG/2ML IJ SOLN
INTRAMUSCULAR | Status: AC
  Filled 2020-09-22: qty 2

## 2020-09-22 MED ORDER — ROCURONIUM BROMIDE 10 MG/ML (PF) SYRINGE
PREFILLED_SYRINGE | INTRAVENOUS | Status: AC
  Filled 2020-09-22: qty 10

## 2020-09-22 MED ORDER — LACTATED RINGERS IV SOLN: INTRAVENOUS | Status: DC | PRN

## 2020-09-22 MED ORDER — PHENYLEPHRINE HCL-NACL 20-0.9 MG/250ML-% IV SOLN
INTRAVENOUS | Status: DC | PRN
  Administered 2020-09-22: 30 ug/min via INTRAVENOUS

## 2020-09-22 MED ORDER — BUPIVACAINE-EPINEPHRINE (PF) 0.25% -1:200000 IJ SOLN
INTRAMUSCULAR | Status: AC
  Filled 2020-09-22: qty 30

## 2020-09-22 MED ORDER — INSULIN ASPART 100 UNIT/ML IJ SOLN
INTRAMUSCULAR | Status: AC
  Filled 2020-09-22: qty 1

## 2020-09-22 MED ORDER — SODIUM CHLORIDE 0.9 % IV SOLN: INTRAVENOUS | Status: DC

## 2020-09-22 MED ORDER — DEXAMETHASONE SODIUM PHOSPHATE 10 MG/ML IJ SOLN
INTRAMUSCULAR | Status: AC
  Filled 2020-09-22: qty 1

## 2020-09-22 MED ORDER — LIDOCAINE 2% (20 MG/ML) 5 ML SYRINGE
INTRAMUSCULAR | Status: DC | PRN
Start: 2020-09-22 — End: 2020-09-22
  Administered 2020-09-22: 20 mg via INTRAVENOUS

## 2020-09-22 MED ORDER — CHLORHEXIDINE GLUCONATE 0.12 % MT SOLN
15.0000 mL | Freq: Once | OROMUCOSAL | Status: AC
  Administered 2020-09-22: 15 mL via OROMUCOSAL
  Filled 2020-09-22: qty 15

## 2020-09-22 MED ORDER — HYDROMORPHONE HCL 1 MG/ML IJ SOLN
INTRAMUSCULAR | Status: AC
  Filled 2020-09-22: qty 1

## 2020-09-22 MED ORDER — HEMOSTATIC AGENTS (NO CHARGE) OPTIME
TOPICAL | Status: DC | PRN
  Administered 2020-09-22 (×3): 1 via TOPICAL

## 2020-09-22 MED ORDER — ACETAMINOPHEN 500 MG PO TABS: 1000.0000 mg | ORAL_TABLET | ORAL | Status: AC

## 2020-09-22 MED ORDER — MIDAZOLAM HCL 5 MG/5ML IJ SOLN
INTRAMUSCULAR | Status: DC | PRN
Start: 2020-09-22 — End: 2020-09-22
  Administered 2020-09-22: 2 mg via INTRAVENOUS

## 2020-09-22 MED ORDER — ONDANSETRON HCL 4 MG/2ML IJ SOLN
INTRAMUSCULAR | Status: AC
  Filled 2020-09-22: qty 2

## 2020-09-22 MED ORDER — ORAL CARE MOUTH RINSE: 15.0000 mL | Freq: Once | OROMUCOSAL | Status: AC

## 2020-09-22 MED ORDER — GABAPENTIN 300 MG PO CAPS: 300.0000 mg | ORAL_CAPSULE | ORAL | Status: AC

## 2020-09-22 MED ORDER — ONDANSETRON 4 MG PO TBDP
4.0000 mg | ORAL_TABLET | Freq: Four times a day (QID) | ORAL | Status: DC | PRN
  Filled 2020-09-22 (×2): qty 1

## 2020-09-22 MED ORDER — ENOXAPARIN SODIUM 40 MG/0.4ML IJ SOSY
40.0000 mg | PREFILLED_SYRINGE | INTRAMUSCULAR | Status: DC
  Administered 2020-09-23: 40 mg via SUBCUTANEOUS
  Filled 2020-09-22: qty 0.4

## 2020-09-22 MED ORDER — HYDROMORPHONE HCL 1 MG/ML IJ SOLN
0.5000 mg | INTRAMUSCULAR | Status: DC | PRN
  Administered 2020-09-22 – 2020-09-23 (×4): 1 mg via INTRAVENOUS
  Filled 2020-09-22 (×4): qty 1

## 2020-09-22 MED ORDER — KETOROLAC TROMETHAMINE 15 MG/ML IJ SOLN
15.0000 mg | Freq: Three times a day (TID) | INTRAMUSCULAR | Status: AC
  Administered 2020-09-22 – 2020-09-27 (×15): 15 mg via INTRAVENOUS
  Filled 2020-09-22 (×15): qty 1

## 2020-09-22 MED ORDER — MIDAZOLAM HCL 2 MG/2ML IJ SOLN
INTRAMUSCULAR | Status: AC
  Filled 2020-09-22: qty 2

## 2020-09-22 MED ORDER — INSULIN ASPART 100 UNIT/ML IJ SOLN
0.0000 [IU] | INTRAMUSCULAR | Status: DC
  Administered 2020-09-23 – 2020-09-24 (×2): 2 [IU] via SUBCUTANEOUS
  Administered 2020-09-25: 1 [IU] via SUBCUTANEOUS

## 2020-09-22 MED ORDER — FENTANYL CITRATE (PF) 250 MCG/5ML IJ SOLN
INTRAMUSCULAR | Status: DC | PRN
  Administered 2020-09-22 (×3): 50 ug via INTRAVENOUS
  Administered 2020-09-22: 100 ug via INTRAVENOUS

## 2020-09-22 MED ORDER — ACETAMINOPHEN 500 MG PO TABS
1000.0000 mg | ORAL_TABLET | Freq: Once | ORAL | Status: AC
  Administered 2020-09-22: 1000 mg via ORAL
  Filled 2020-09-22: qty 2

## 2020-09-22 MED ORDER — ONDANSETRON HCL 4 MG/2ML IJ SOLN
INTRAMUSCULAR | Status: DC | PRN
  Administered 2020-09-22: 4 mg via INTRAVENOUS

## 2020-09-22 MED ORDER — CEFAZOLIN SODIUM-DEXTROSE 2-4 GM/100ML-% IV SOLN
INTRAVENOUS | Status: AC
  Filled 2020-09-22: qty 100

## 2020-09-22 MED ORDER — ALBUMIN HUMAN 5 % IV SOLN: INTRAVENOUS | Status: DC | PRN

## 2020-09-22 MED ORDER — LACTATED RINGERS IV SOLN: INTRAVENOUS | Status: DC

## 2020-09-22 MED ORDER — FENTANYL CITRATE (PF) 100 MCG/2ML IJ SOLN
25.0000 ug | INTRAMUSCULAR | Status: DC | PRN
  Administered 2020-09-22 (×2): 50 ug via INTRAVENOUS

## 2020-09-22 MED ORDER — CEFAZOLIN SODIUM-DEXTROSE 2-4 GM/100ML-% IV SOLN
2.0000 g | INTRAVENOUS | Status: AC
  Administered 2020-09-22: 2 g via INTRAVENOUS

## 2020-09-22 MED ORDER — SUGAMMADEX SODIUM 200 MG/2ML IV SOLN
INTRAVENOUS | Status: DC | PRN
  Administered 2020-09-22: 200 mg via INTRAVENOUS

## 2020-09-22 MED ORDER — KETOROLAC TROMETHAMINE 15 MG/ML IJ SOLN
INTRAMUSCULAR | Status: AC
  Filled 2020-09-22: qty 1

## 2020-09-22 MED ORDER — LIDOCAINE 2% (20 MG/ML) 5 ML SYRINGE
INTRAMUSCULAR | Status: AC
  Filled 2020-09-22: qty 5

## 2020-09-22 MED ORDER — SODIUM CHLORIDE 0.9 % IR SOLN
Status: DC | PRN
  Administered 2020-09-22: 1000 mL

## 2020-09-22 MED ORDER — PROPOFOL 10 MG/ML IV BOLUS
INTRAVENOUS | Status: DC | PRN
  Administered 2020-09-22: 150 mg via INTRAVENOUS

## 2020-09-22 MED ORDER — DEXAMETHASONE SODIUM PHOSPHATE 10 MG/ML IJ SOLN
INTRAMUSCULAR | Status: DC | PRN
  Administered 2020-09-22 (×2): 5 mg

## 2020-09-22 MED ORDER — ROPIVACAINE HCL 5 MG/ML IJ SOLN
INTRAMUSCULAR | Status: DC | PRN
  Administered 2020-09-22 (×2): 20 mL via PERINEURAL

## 2020-09-22 MED ORDER — FENTANYL CITRATE (PF) 250 MCG/5ML IJ SOLN
INTRAMUSCULAR | Status: AC
  Filled 2020-09-22: qty 5

## 2020-09-22 MED ORDER — DEXAMETHASONE SODIUM PHOSPHATE 10 MG/ML IJ SOLN
INTRAMUSCULAR | Status: DC | PRN
  Administered 2020-09-22: 10 mg via INTRAVENOUS

## 2020-09-22 MED ORDER — ROPIVACAINE HCL 5 MG/ML IJ SOLN: INTRAMUSCULAR | Status: DC | PRN

## 2020-09-22 MED ORDER — PROPOFOL 10 MG/ML IV BOLUS
INTRAVENOUS | Status: AC
  Filled 2020-09-22: qty 40

## 2020-09-22 MED ORDER — ONDANSETRON HCL 4 MG/2ML IJ SOLN
4.0000 mg | Freq: Four times a day (QID) | INTRAMUSCULAR | Status: DC | PRN
  Administered 2020-09-26: 4 mg via INTRAVENOUS
  Filled 2020-09-22: qty 2

## 2020-09-22 MED ORDER — ROCURONIUM BROMIDE 10 MG/ML (PF) SYRINGE
PREFILLED_SYRINGE | INTRAVENOUS | Status: DC | PRN
  Administered 2020-09-22: 70 mg via INTRAVENOUS
  Administered 2020-09-22: 30 mg via INTRAVENOUS

## 2020-09-22 MED ORDER — GABAPENTIN 300 MG PO CAPS
ORAL_CAPSULE | ORAL | Status: AC
  Administered 2020-09-22: 300 mg via ORAL
  Filled 2020-09-22: qty 1

## 2020-09-22 MED ORDER — 0.9 % SODIUM CHLORIDE (POUR BTL) OPTIME
TOPICAL | Status: DC | PRN
  Administered 2020-09-22 (×2): 1000 mL

## 2020-09-22 MED ORDER — PHENYLEPHRINE 40 MCG/ML (10ML) SYRINGE FOR IV PUSH (FOR BLOOD PRESSURE SUPPORT)
PREFILLED_SYRINGE | INTRAVENOUS | Status: DC | PRN
Start: 2020-09-22 — End: 2020-09-22
  Administered 2020-09-22 (×3): 80 ug via INTRAVENOUS
  Administered 2020-09-22 (×2): 40 ug via INTRAVENOUS
  Administered 2020-09-22: 80 ug via INTRAVENOUS

## 2020-09-22 SURGICAL SUPPLY — 128 items
APPLICATOR ARISTA FLEXITIP XL (MISCELLANEOUS) ×3 IMPLANT
APPLIER CLIP 11 MED OPEN (CLIP)
APPLIER CLIP 13 LRG OPEN (CLIP)
APPLIER CLIP 5 13 M/L LIGAMAX5 (MISCELLANEOUS) ×3
BAG BILE T-TUBES STRL (MISCELLANEOUS) IMPLANT
BAG COUNTER SPONGE SURGICOUNT (BAG) ×3 IMPLANT
BIOPATCH RED 1 DISK 7.0 (GAUZE/BANDAGES/DRESSINGS) ×3 IMPLANT
BLADE CLIPPER SURG (BLADE) IMPLANT
CANISTER SUCT 3000ML PPV (MISCELLANEOUS) ×3 IMPLANT
CHLORAPREP W/TINT 26 (MISCELLANEOUS) ×3 IMPLANT
CLIP APPLIE 11 MED OPEN (CLIP) IMPLANT
CLIP APPLIE 13 LRG OPEN (CLIP) IMPLANT
CLIP APPLIE 5 13 M/L LIGAMAX5 (MISCELLANEOUS) ×2 IMPLANT
CLIP LIGATING HEMO LOK XL GOLD (MISCELLANEOUS) ×3 IMPLANT
CLIP LIGATING HEMO O LOK GREEN (MISCELLANEOUS) ×3 IMPLANT
CLIP TI MEDIUM 24 (CLIP) IMPLANT
CLIP TI WIDE RED SMALL 24 (CLIP) IMPLANT
CLIP VESOCCLUDE LG 6/CT (CLIP) IMPLANT
CLIP VESOCCLUDE MED 6/CT (CLIP) IMPLANT
CLIP VESOLOCK LG 6/CT PURPLE (CLIP) IMPLANT
CLIP VESOLOCK MED LG 6/CT (CLIP) IMPLANT
COVER SURGICAL LIGHT HANDLE (MISCELLANEOUS) ×3 IMPLANT
DEFOGGER SCOPE WARMER CLEARIFY (MISCELLANEOUS) IMPLANT
DERMABOND ADVANCED (GAUZE/BANDAGES/DRESSINGS) ×1
DERMABOND ADVANCED .7 DNX12 (GAUZE/BANDAGES/DRESSINGS) ×2 IMPLANT
DRAIN CHANNEL 19F RND (DRAIN) ×3 IMPLANT
DRAPE INCISE IOBAN 66X45 STRL (DRAPES) ×3 IMPLANT
DRAPE LAPAROSCOPIC ABDOMINAL (DRAPES) IMPLANT
DRAPE WARM FLUID 44X44 (DRAPES) ×3 IMPLANT
DRSG TEGADERM 4X4.5 CHG (GAUZE/BANDAGES/DRESSINGS) IMPLANT
DRSG TEGADERM 4X4.75 (GAUZE/BANDAGES/DRESSINGS) ×3 IMPLANT
ELECT BLADE 4.0 EZ CLEAN MEGAD (MISCELLANEOUS)
ELECT BLADE 6.5 EXT (BLADE) IMPLANT
ELECT CAUTERY BLADE 6.4 (BLADE) IMPLANT
ELECT PAD DSPR THERM+ ADLT (MISCELLANEOUS) ×3 IMPLANT
ELECT REM PT RETURN 9FT ADLT (ELECTROSURGICAL) ×3
ELECTRODE BLDE 4.0 EZ CLN MEGD (MISCELLANEOUS) IMPLANT
ELECTRODE REM PT RTRN 9FT ADLT (ELECTROSURGICAL) ×2 IMPLANT
EVACUATOR SILICONE 100CC (DRAIN) ×3 IMPLANT
GAUZE SPONGE 4X4 12PLY STRL (GAUZE/BANDAGES/DRESSINGS) IMPLANT
GEL PDS (MISCELLANEOUS) IMPLANT
GLOVE SRG 8 PF TXTR STRL LF DI (GLOVE) IMPLANT
GLOVE SURG POLY MICRO LF SZ5.5 (GLOVE) IMPLANT
GLOVE SURG UNDER POLY LF SZ6 (GLOVE) ×3 IMPLANT
GLOVE SURG UNDER POLY LF SZ8 (GLOVE)
GOWN STRL REUS W/ TWL LRG LVL3 (GOWN DISPOSABLE) ×8 IMPLANT
GOWN STRL REUS W/ TWL XL LVL3 (GOWN DISPOSABLE) ×2 IMPLANT
GOWN STRL REUS W/TWL LRG LVL3 (GOWN DISPOSABLE) ×12
GOWN STRL REUS W/TWL XL LVL3 (GOWN DISPOSABLE) ×3
HAND PENCIL TRP OPTION (MISCELLANEOUS) IMPLANT
HANDLE SUCTION POOLE (INSTRUMENTS) IMPLANT
HEMOSTAT ARISTA ABSORB 3G PWDR (HEMOSTASIS) ×3 IMPLANT
HEMOSTAT SNOW SURGICEL 2X4 (HEMOSTASIS) ×6 IMPLANT
KIT BASIN OR (CUSTOM PROCEDURE TRAY) ×3 IMPLANT
KIT TURNOVER KIT B (KITS) ×3 IMPLANT
L-HOOK LAP DISP 36CM (ELECTROSURGICAL) ×3
LHOOK LAP DISP 36CM (ELECTROSURGICAL) ×2 IMPLANT
LIGASURE IMPACT 36 18CM CVD LR (INSTRUMENTS) IMPLANT
LOOP VESSEL MINI RED (MISCELLANEOUS) IMPLANT
NS IRRIG 1000ML POUR BTL (IV SOLUTION) ×6 IMPLANT
PACK GENERAL/GYN (CUSTOM PROCEDURE TRAY) IMPLANT
PAD ARMBOARD 7.5X6 YLW CONV (MISCELLANEOUS) ×6 IMPLANT
PENCIL SMOKE EVACUATOR (MISCELLANEOUS) ×3 IMPLANT
RELOAD STAPLER GREEN 60MM (STAPLE) ×6 IMPLANT
RELOAD STAPLER WHITE 60MM (STAPLE) ×6 IMPLANT
SCISSORS LAP 5X35 DISP (ENDOMECHANICALS) ×3 IMPLANT
SET IRRIG TUBING LAPAROSCOPIC (IRRIGATION / IRRIGATOR) ×3 IMPLANT
SET TUBE SMOKE EVAC HIGH FLOW (TUBING) ×3 IMPLANT
SHEARS 1100 HARMONIC 36 (ELECTROSURGICAL) ×3 IMPLANT
SLEEVE ENDOPATH XCEL 5M (ENDOMECHANICALS) ×6 IMPLANT
SPECIMEN JAR SMALL (MISCELLANEOUS) ×3 IMPLANT
SPECIMEN JAR X LARGE (MISCELLANEOUS) ×3 IMPLANT
SPONGE INTESTINAL PEANUT (DISPOSABLE) IMPLANT
SPONGE SURGIFOAM ABS GEL 100 (HEMOSTASIS) IMPLANT
SPONGE T-LAP 18X18 ~~LOC~~+RFID (SPONGE) ×3 IMPLANT
STAPLE ECHEON FLEX 60 POW ENDO (STAPLE) ×3 IMPLANT
STAPLE LINE REINFORCEMENT LAP (STAPLE) ×6 IMPLANT
STAPLER RELOAD GREEN 60MM (STAPLE) ×9
STAPLER RELOAD WHITE 60MM (STAPLE) ×9
STAPLER VISISTAT 35W (STAPLE) IMPLANT
SUCTION POOLE HANDLE (INSTRUMENTS)
SUT CHROMIC 0 BP (SUTURE) IMPLANT
SUT DVC VLOC 180 2-0 12IN GS21 (SUTURE) ×3
SUT ETHILON 2 0 FS 18 (SUTURE) ×3 IMPLANT
SUT MNCRL AB 4-0 PS2 18 (SUTURE) ×6 IMPLANT
SUT NOVA NAB DX-16 0-1 5-0 T12 (SUTURE) IMPLANT
SUT PDS AB 1 CTX 36 (SUTURE) ×6 IMPLANT
SUT PDS AB 1 TP1 96 (SUTURE) IMPLANT
SUT PDS AB 2-0 CT1 27 (SUTURE) IMPLANT
SUT PDS AB 3-0 SH 27 (SUTURE) IMPLANT
SUT PDS II 0 TP-1 LOOPED 60 (SUTURE) IMPLANT
SUT PROLENE 2 0 SH 30 (SUTURE) IMPLANT
SUT PROLENE 3 0 SH 1 (SUTURE) IMPLANT
SUT PROLENE 3 0 SH 48 (SUTURE) ×3 IMPLANT
SUT PROLENE 3 0 SH DA (SUTURE) IMPLANT
SUT PROLENE 4 0 RB 1 (SUTURE) ×3
SUT PROLENE 4 0 SH DA (SUTURE) IMPLANT
SUT PROLENE 4-0 RB1 .5 CRCL 36 (SUTURE) ×2 IMPLANT
SUT PROLENE 5 0 C1 (SUTURE) IMPLANT
SUT PROLENE 5 0 RB 1 DA (SUTURE) IMPLANT
SUT SILK 0 TIES 10X30 (SUTURE) IMPLANT
SUT SILK 2 0 SH (SUTURE) IMPLANT
SUT SILK 2 0 TIES 10X30 (SUTURE) IMPLANT
SUT SILK 2 0SH CR/8 30 (SUTURE) IMPLANT
SUT SILK 3 0 TIES 10X30 (SUTURE) IMPLANT
SUT SILK 3 0SH CR/8 30 (SUTURE) IMPLANT
SUT VIC AB 2-0 CT1 27 (SUTURE)
SUT VIC AB 2-0 CT1 TAPERPNT 27 (SUTURE) IMPLANT
SUT VIC AB 3-0 SH 18 (SUTURE) IMPLANT
SUT VIC AB 3-0 SH 27 (SUTURE) ×6
SUT VIC AB 3-0 SH 27X BRD (SUTURE) ×4 IMPLANT
SUT VIC AB 3-0 SH 27XBRD (SUTURE) IMPLANT
SUT VICRYL 0 AB UR-6 (SUTURE) IMPLANT
SUTURE DVC VL 180 2-0 12INGS21 (SUTURE) ×2 IMPLANT
SYS LAPSCP GELPORT 120MM (MISCELLANEOUS) ×3
SYSTEM LAPSCP GELPORT 120MM (MISCELLANEOUS) ×2 IMPLANT
TOWEL GREEN STERILE (TOWEL DISPOSABLE) ×3 IMPLANT
TOWEL GREEN STERILE FF (TOWEL DISPOSABLE) ×3 IMPLANT
TRAY FOLEY MTR SLVR 14FR STAT (SET/KITS/TRAYS/PACK) ×3 IMPLANT
TRAY FOLEY W/BAG SLVR 14FR (SET/KITS/TRAYS/PACK) IMPLANT
TRAY LAPAROSCOPIC MC (CUSTOM PROCEDURE TRAY) ×3 IMPLANT
TROCAR XCEL 12X100 BLDLESS (ENDOMECHANICALS) ×6 IMPLANT
TROCAR XCEL BLUNT TIP 100MML (ENDOMECHANICALS) IMPLANT
TROCAR XCEL NON-BLD 5MMX100MML (ENDOMECHANICALS) ×3 IMPLANT
TUBE CONNECTING 12X1/4 (SUCTIONS) IMPLANT
WARMER LAPAROSCOPE (MISCELLANEOUS) ×3 IMPLANT
WATER STERILE IRR 1000ML POUR (IV SOLUTION) ×3 IMPLANT
YANKAUER SUCT BULB TIP NO VENT (SUCTIONS) IMPLANT

## 2020-09-22 NOTE — Transfer of Care (Signed)
Immediate Anesthesia Transfer of Care Note  Patient: [name]  Procedure(s) Performed: LAPAROSCOPIC DISTAL PANCREATECTOMY (Abdomen) LAPAROSCOPIC SPLENECTOMY (Abdomen)  Patient Location: PACU  Anesthesia Type:General  Level of Consciousness: drowsy  Airway & Oxygen Therapy: Patient Spontanous Breathing and Patient connected to nasal cannula oxygen  Post-op Assessment: Report given to RN, Post -op Vital signs reviewed and stable and Patient moving all extremities  Post vital signs: Reviewed and stable  Last Vitals:  Vitals Value Taken Time  BP 130/104 09/22/20 1133  Temp    Pulse 87 09/22/20 1136  Resp 16 09/22/20 1136  SpO2 100 % 09/22/20 1136  Vitals shown include unvalidated device data.  Last Pain:  Vitals:   09/22/20 0607  TempSrc:   PainSc: 0-No pain         Complications: No notable events documented.

## 2020-09-22 NOTE — Anesthesia Procedure Notes (Signed)
Arterial Line Insertion Start/End9/08/2020 7:40 AM, 09/22/2020 7:44 AM Performed by: Harden Mo, CRNA, CRNA  Patient location: OR. Preanesthetic checklist: patient identified, IV checked, site marked, risks and benefits discussed, surgical consent, monitors and equipment checked, pre-op evaluation, timeout performed and anesthesia consent Patient sedated Left, radial was placed Catheter size: 20 G Hand hygiene performed  and maximum sterile barriers used  Allen's test indicative of satisfactory collateral circulation Attempts: 1 Procedure performed without using ultrasound guided technique. Ultrasound Notes:anatomy identified, needle tip was noted to be adjacent to the nerve/plexus identified and no ultrasound evidence of intravascular and/or intraneural injection Following insertion, dressing applied and Biopatch. Patient tolerated the procedure well with no immediate complications.

## 2020-09-22 NOTE — Op Note (Signed)
Date: 09/22/20  Patient: [name] MRN: [id number]  Preoperative Diagnosis: Pancreatic cystic neoplasm Postoperative Diagnosis: Same  Procedure: Laparoscopic distal pancreatectomy with splenectomy, excisional biopsy of gastric serosal nodule  Surgeon: Michaelle Birks, MD Assistant: Donnie Mesa, MD  EBL: 200 mL  Anesthesia: General endotracheal  Specimens: Distal pancreas with spleen Gastric nodule  Indications: [name] is a 50 yo female who was referred for a cystic mass in the tail of the pancreas. It was originally noted incidentally in 2005 and was asymptomatic at that time. She had an episode of acute pancreatitis in 2020, and the cyst has significantly enlarged in the last few years. EUS showed a septated mass, but FNA was not possible due to the presence of a large vessel. Surgical excision was recommended due to concern for a mucinous cystic neoplasm vs IPMN.  Findings: Large well-circumscribed cystic mass in the tail of the pancreas involving the splenic hilum. Mild adhesions surrounding the cyst, consistent with prior acute pancreatitis. Small pedunculated nodule on the serosal surface of the gastric body.  Procedure details: Informed consent was obtained in the preoperative area prior to the procedure. The patient was brought to the operating room and placed on the table in the supine position. General anesthesia was induced and appropriate lines and drains were placed for intraoperative monitoring. Perioperative antibiotics were administered per SCIP guidelines. The abdomen was prepped and draped in the usual sterile fashion. A pre-procedure timeout was taken verifying patient identity, surgical site and procedure to be performed.  A small midline incision was made superior to the umbilicus, the subcutaneous tissue was divided with cautery and the fascia was opened along the linea alba.  A GelPort was placed and the abdomen was insufflated.  A 5 mm port and 12 mm port were  placed via the GelPort, and the peritoneal cavity was inspected with no evidence of visceral or vascular injury.  A 12 mm port was placed in the left upper mid abdomen under direct visualization, followed by 5 mm port in the left subcostal margin.  The gastrocolic omentum was opened along the lesser curve of the stomach with harmonic shears and the lesser sac was entered.  Dissection was then carried caudad along the greater curve of the stomach and the short gastric vessels were divided with the harmonic.  A Nathanson retractor was then placed underneath the stomach to lift it off the pancreas.  There was a large well-circumscribed cystic lesion in the tail of the pancreas abutting the splenic hilum.  The body of the pancreas was normal in appearance.  The inferior border of the pancreas was identified along the body and dissected out using the harmonic.  This dissection was carried distally towards the tail of the pancreas along the cyst.  The superior border of the pancreas was then identified at the body and the peritoneum at this point was opened with the harmonic.  A plane was then created under the body of the pancreas using the harmonic to divide the retroperitoneal attachments.  The splenic vein was identified and circumferentially dissected out using blunt dissection.  The splenic vein was then divided with a white load of a 60 mm stapler.  The body of the pancreas was then further dissected off the retroperitoneum to create a complete tunnel underneath the body.  The body of the pancreas was then divided with a 60 mm stapler with a green load with Peri-Strips.  The splenic artery could not be separated and was divided with the  pancreatic parenchyma.  Next the tail of the pancreas and the cyst were dissected off the retroperitoneum using the harmonic.  There were some inflammatory adhesions consistent with the patient's prior episode of acute pancreatitis.  The splenic flexure of the colon was taken down  with harmonic shears. The lienophrenic and lienorenal attachments were divided with the harmonic to completely mobilize the spleen and free the specimen.  The tail of the pancreas and spleen were then removed en bloc via the [place].  The specimen was examined and the cyst appeared completely intact with no violation of the cyst wall, with a margin of normal pancreatic parenchyma between the cyst and the pancreatic staple line.  The surgical site was thoroughly irrigated and appeared hemostatic.  There was a defect in the transverse mesocolon near the ligament of Treitz, and this was closed with a running 2-0 V-loc suture to prevent an internal hernia.   A small nodule approximately 1 cm in diameter had been noted on the surface of the distal body of the stomach.  This was on a very small stalk attached to the serosa.  The stalk was sharply divided to excise the nodule, which was sent for routine pathology.  Emogene Morgan was applied to the biopsy site to achieve hemostasis.  Arista was then placed on the retroperitoneum, splenic fossa and pancreatic staple line. A 19 French round JP drain was placed adjacent to the tail of the pancreas and in the splenic fossa, and brought out through the left upper quadrant 5 mm port site.  It was secured to the skin with a 2-0 nylon suture.  The Nathanson retractor was removed and the stomach was placed back in its proper anatomic position.  Confirmation of the NG tube tip within the stomach was confirmed.  The ports were all removed and the abdomen was desufflated.  The left upper quadrant 12 mm port site fascia was closed with a 0 Vicryl suture.  The fascia at the GelPort site was closed with a running 1 PDS suture.  Scarpa's fascia was closed with a running 3-0 Vicryl suture.  The skin at all port sites was closed with 4-0 Monocryl subcuticular suture.  Dermabond was applied.  The patient tolerated the procedure well with no apparent complications.  All counts were correct x2 at  the end of the procedure. The patient was extubated and taken to PACU in stable condition.  Michaelle Birks, MD 09/22/20 12:16 PM

## 2020-09-22 NOTE — Anesthesia Postprocedure Evaluation (Signed)
Anesthesia Post Note  Patient: MARIAELIZABETH SZCZESNIAK  Procedure(s) Performed: LAPAROSCOPIC DISTAL PANCREATECTOMY (Abdomen) LAPAROSCOPIC SPLENECTOMY (Abdomen)     Patient location during evaluation: PACU Anesthesia Type: Regional and General Level of consciousness: awake and alert Pain management: pain level controlled Vital Signs Assessment: post-procedure vital signs reviewed and stable Respiratory status: spontaneous breathing, nonlabored ventilation, respiratory function stable and patient connected to nasal cannula oxygen Cardiovascular status: blood pressure returned to baseline and stable Postop Assessment: no apparent nausea or vomiting Anesthetic complications: no   No notable events documented.  Last Vitals:  Vitals:   09/22/20 1435 09/22/20 1505  BP: 127/87 (!) 124/95  Pulse: 90 94  Resp: 11 11  Temp:    SpO2: 96% 92%    Last Pain:  Vitals:   09/22/20 1505  TempSrc:   PainSc: Asleep                 Patti Shorb L Azucena Dart

## 2020-09-22 NOTE — Anesthesia Procedure Notes (Signed)
Anesthesia Regional Block: Quadratus lumborum   Pre-Anesthetic Checklist: , timeout performed,  Correct Patient, Correct Site, Correct Laterality,  Correct Procedure, Correct Position, site marked,  Risks and benefits discussed,  Surgical consent,  Pre-op evaluation,  At surgeon's request and post-op pain management  Laterality: Right  Prep: Maximum Sterile Barrier Precautions used, chloraprep       Needles:  Injection technique: Single-shot  Needle Type: Echogenic Stimulator Needle     Needle Length: 9cm  Needle Gauge: 22     Additional Needles:   Procedures:,,,, ultrasound used (permanent image in chart),,    Narrative:  Start time: 09/22/2020 7:07 AM End time: 09/22/2020 7:12 AM Injection made incrementally with aspirations every 5 mL.  Performed by: Personally  Anesthesiologist: Freddrick March, MD  Additional Notes: Monitors applied. No increased pain on injection. No increased resistance to injection. Injection made in 5cc increments. Good needle visualization. Patient tolerated procedure well.

## 2020-09-22 NOTE — Anesthesia Procedure Notes (Addendum)
Procedure Name: Intubation Date/Time: 09/22/2020 7:39 AM Performed by: Justin Mend, RN Pre-anesthesia Checklist: Patient identified, Emergency Drugs available, Suction available and Patient being monitored Patient Re-evaluated:Patient Re-evaluated prior to induction Oxygen Delivery Method: Circle System Utilized Preoxygenation: Pre-oxygenation with 100% oxygen Induction Type: IV induction and Cricoid Pressure applied Ventilation: Mask ventilation without difficulty and Oral airway inserted - appropriate to patient size Laryngoscope Size: 4 and Mac Grade View: Grade I Tube type: Oral Tube size: 7.0 mm Number of attempts: 1 Airway Equipment and Method: Stylet and Oral airway Placement Confirmation: ETT inserted through vocal cords under direct vision, positive ETCO2 and breath sounds checked- equal and bilateral Secured at: 23 cm Tube secured with: Tape Dental Injury: Teeth and Oropharynx as per pre-operative assessment  Comments: Intubation by Morton Peters

## 2020-09-22 NOTE — H&P (Signed)
[name] is an 50 y.o. female.   Chief Complaint: pancreatic cyst HPI: [name] is a 50 yo female with an enlarging cystic lesion of the tail of the pancreas. The cyst has been present for over 10 years, and 2 years ago she had acute pancreatitis involving the tail (this was her first episode of pancreatitis). The cyst has significantly enlarged since then. EUS was performed and showed a septated cyst but no obvious ductal communication or mural nodules. FNA could not be performed due to the presence of a large vessel adjacent to the cyst. She presents today for surgical resection. She was not able to get the meningogoccal vaccine but received the other post-splenectomy vaccines.  Past Medical History:  Diagnosis Date   Anxiety    Depression    history - no current prob - no meds    Past Surgical History:  Procedure Laterality Date   CESAREAN SECTION     x 4   CYSTOSCOPY  12/13/2010   Procedure: CYSTOSCOPY;  Surgeon: Eldred Manges, MD;  Location: [facility];  Service: Gynecology;;   ESOPHAGOGASTRODUODENOSCOPY (EGD) WITH PROPOFOL N/A 04/26/2020   Procedure: ESOPHAGOGASTRODUODENOSCOPY (EGD) WITH PROPOFOL;  Surgeon: Arta Silence, MD;  Location: [facility];  Service: Endoscopy;  Laterality: N/A;   EYE MUSCLE SURGERY     as child - right eye   HYSTERECTOMY ABDOMINAL WITH SALPINGECTOMY  11/2010   TUBAL LIGATION     UPPER ESOPHAGEAL ENDOSCOPIC ULTRASOUND (EUS) N/A 04/26/2020   Procedure: UPPER ESOPHAGEAL ENDOSCOPIC ULTRASOUND (EUS) Linear;  Surgeon: Arta Silence, MD;  Location: [facility];  Service: Endoscopy;  Laterality: N/A;    Family History  Problem Relation Age of Onset   Diabetes Mother    Diabetes Father    Diabetes Brother    Cancer Brother    Hypertension Brother    Diabetes Brother    Bipolar disorder Brother    Social History:  reports that she has never smoked. She has never used smokeless tobacco. She reports current alcohol use. She reports that she does  not use drugs.  Allergies: No Known Allergies  Medications Prior to Admission  Medication Sig Dispense Refill   valACYclovir (VALTREX) 500 MG tablet Take 500 mg by mouth 2 (two) times daily as needed (outbreaks).     docusate sodium (COLACE) 100 MG capsule Take 100 mg by mouth daily as needed for moderate constipation.     ibuprofen (ADVIL) 200 MG tablet Take 400 mg by mouth every 6 (six) hours as needed for headache or moderate pain.     Vitamin D, Ergocalciferol, (DRISDOL) 1.25 MG (50000 UNIT) CAPS capsule Take 50,000 Units by mouth 3 days.      Results for orders placed or performed during the hospital encounter of 09/22/20 (from the past 48 hour(s))  Type and screen [place]     Status: None (Preliminary result)   Collection Time: 09/22/20  6:11 AM  Result Value Ref Range   ABO/RH(D) PENDING    Antibody Screen PENDING    Sample Expiration      09/25/2020,2359 Performed at [facility], [place] [address] [place], [place] [address]    No results found.  Review of Systems  Constitutional:  Negative for activity change, appetite change and fever.  Respiratory:  Negative for shortness of breath and stridor.   Gastrointestinal:  Negative for abdominal pain, nausea and vomiting.  Neurological:  Negative for facial asymmetry and speech difficulty.  Psychiatric/Behavioral:  Negative for  agitation and confusion.    Blood pressure (!) 138/93, pulse 71, temperature 98.5 F (36.9 C), temperature source Oral, resp. rate 17, height '5\' 6"'$  (1.676 m), weight 79.4 kg, last menstrual period 11/24/2010, SpO2 99 %. Physical Exam Vitals reviewed.  Constitutional:      Appearance: Normal appearance.  HENT:     Head: Normocephalic and atraumatic.  Eyes:     General: No scleral icterus.    Conjunctiva/sclera: Conjunctivae normal.  Pulmonary:     Effort: Pulmonary effort is normal. No respiratory distress.  Abdominal:     General: There is no distension.      Palpations: Abdomen is soft. There is no mass.     Tenderness: There is no abdominal tenderness.  Musculoskeletal:        General: Normal range of motion.     Cervical back: Normal range of motion.  Skin:    General: Skin is warm and dry.     Coloration: Skin is not jaundiced.  Neurological:     General: No focal deficit present.     Mental Status: She is alert and oriented to person, place, and time.  Psychiatric:        Mood and Affect: Mood normal.        Behavior: Behavior normal.        Thought Content: Thought content normal.     Assessment/Plan 50 yo female with enlarging tail of pancreas cyst, associated with an episode of acute pancreatitis. This is suspicious for either an MCN or IPMN, both of which have malignant potential and thus surgical resection is recommended. Proceed to OR for laparoscopic distal pancreatectomy and likely splenectomy. Patient has gallstones but denies symptoms of biliary colic so will not perform cholecystectomy. Admit to inpatient postoperatively. Informed consent obtained.  Dwan Bolt, MD 09/22/2020, 7:14 AM

## 2020-09-22 NOTE — Anesthesia Procedure Notes (Addendum)
Anesthesia Regional Block: Quadratus lumborum   Pre-Anesthetic Checklist: , timeout performed,  Correct Patient, Correct Site, Correct Laterality,  Correct Procedure, Correct Position, site marked,  Risks and benefits discussed,  Surgical consent,  Pre-op evaluation,  At surgeon's request and post-op pain management  Laterality: Left  Prep: Maximum Sterile Barrier Precautions used, chloraprep       Needles:  Injection technique: Single-shot  Needle Type: Echogenic Stimulator Needle     Needle Length: 9cm  Needle Gauge: 22     Additional Needles:   Procedures:,,,, ultrasound used (permanent image in chart),,    Narrative:  Start time: 09/22/2020 7:02 AM End time: 09/22/2020 7:07 AM Injection made incrementally with aspirations every 5 mL.  Performed by: Personally  Anesthesiologist: Freddrick March, MD  Additional Notes: Monitors applied. No increased pain on injection. No increased resistance to injection. Injection made in 5cc increments. Good needle visualization. Patient tolerated procedure well.

## 2020-09-23 ENCOUNTER — Encounter (HOSPITAL_COMMUNITY): Payer: Self-pay | Admitting: Surgery

## 2020-09-23 LAB — CBC
HCT: 27.7 % — ABNORMAL LOW (ref 36.0–46.0)
Hemoglobin: 9.2 g/dL — ABNORMAL LOW (ref 12.0–15.0)
MCH: 32.3 pg (ref 26.0–34.0)
MCHC: 33.2 g/dL (ref 30.0–36.0)
MCV: 97.2 fL (ref 80.0–100.0)
Platelets: 183 10*3/uL (ref 150–400)
RBC: 2.85 MIL/uL — ABNORMAL LOW (ref 3.87–5.11)
RDW: 12.8 % (ref 11.5–15.5)
WBC: 12.7 10*3/uL — ABNORMAL HIGH (ref 4.0–10.5)
nRBC: 0 % (ref 0.0–0.2)

## 2020-09-23 LAB — BASIC METABOLIC PANEL
Anion gap: 6 (ref 5–15)
BUN: 13 mg/dL (ref 6–20)
CO2: 26 mmol/L (ref 22–32)
Calcium: 9.5 mg/dL (ref 8.9–10.3)
Chloride: 105 mmol/L (ref 98–111)
Creatinine, Ser: 0.81 mg/dL (ref 0.44–1.00)
GFR, Estimated: >60 mL/min (ref >60)
Glucose, Bld: 139 mg/dL — ABNORMAL HIGH (ref 70–99)
Potassium: 4.8 mmol/L (ref 3.5–5.1)
Sodium: 137 mmol/L (ref 135–145)

## 2020-09-23 LAB — GLUCOSE, CAPILLARY
Glucose-Capillary: 103 mg/dL — ABNORMAL HIGH (ref 70–99)
Glucose-Capillary: 108 mg/dL — ABNORMAL HIGH (ref 70–99)
Glucose-Capillary: 112 mg/dL — ABNORMAL HIGH (ref 70–99)
Glucose-Capillary: 119 mg/dL — ABNORMAL HIGH (ref 70–99)
Glucose-Capillary: 124 mg/dL — ABNORMAL HIGH (ref 70–99)
Glucose-Capillary: 129 mg/dL — ABNORMAL HIGH (ref 70–99)

## 2020-09-23 MED ORDER — METHOCARBAMOL 500 MG PO TABS
500.0000 mg | ORAL_TABLET | Freq: Three times a day (TID) | ORAL | Status: DC
  Administered 2020-09-23 – 2020-09-27 (×16): 500 mg via ORAL
  Filled 2020-09-23 (×16): qty 1

## 2020-09-23 MED ORDER — ACETAMINOPHEN 500 MG PO TABS
1000.0000 mg | ORAL_TABLET | Freq: Three times a day (TID) | ORAL | Status: DC
  Administered 2020-09-23 – 2020-09-27 (×13): 1000 mg via ORAL
  Filled 2020-09-23 (×13): qty 2

## 2020-09-23 MED ORDER — DEXTROSE-NACL 5-0.45 % IV SOLN: INTRAVENOUS | Status: DC

## 2020-09-23 MED ORDER — HYDROMORPHONE HCL 1 MG/ML IJ SOLN
0.5000 mg | INTRAMUSCULAR | Status: DC | PRN
  Administered 2020-09-23 – 2020-09-25 (×4): 0.5 mg via INTRAVENOUS
  Filled 2020-09-23 (×3): qty 0.5
  Filled 2020-09-23: qty 1

## 2020-09-23 MED ORDER — OXYCODONE HCL 5 MG PO TABS
5.0000 mg | ORAL_TABLET | ORAL | Status: DC | PRN
  Administered 2020-09-24 (×2): 5 mg via ORAL
  Filled 2020-09-23 (×2): qty 1

## 2020-09-23 NOTE — Evaluation (Signed)
Physical Therapy Evaluation Patient Details Name: [name] MRN: [id number] DOB: Dec 01, 1970 Today's Date: 09/23/2020   History of Present Illness  50 yo female s/p Laparoscopic distal pancreatectomy with splenectomy, excisional biopsy of gastric serosal nodule on 9/8. PMH includes depression, anxiety, hysterectomy.  Clinical Impression   Pt presents with moderate abdominal pain, decreased knowledge and application of abdominal precautions, impaired activity tolerance vs baseline. Pt to benefit from acute PT to address deficits. Pt ambulated hallway distance with use of IV pole to steady self, does not require physical assist and PT expects pt to progress well acutely. PT to progress mobility as tolerated, and will continue to follow acutely.      Follow Up Recommendations No PT follow up;Supervision for mobility/OOB    Equipment Recommendations  None recommended by PT    Recommendations for Other Services       Precautions / Restrictions Precautions Precautions: Other (comment) Precaution Comments: abdominal Restrictions Weight Bearing Restrictions: No      Mobility  Bed Mobility Overal bed mobility: Needs Assistance Bed Mobility: Rolling;Sidelying to Sit;Sit to Sidelying Rolling: Supervision Sidelying to sit: Supervision     Sit to sidelying: Supervision General bed mobility comments: cues for log roll technique, increased time secondary to pain.    Transfers Overall transfer level: Needs assistance   Transfers: Sit to/from Stand Sit to Stand: Supervision         General transfer comment: for safety, slow to rise.  Ambulation/Gait Ambulation/Gait assistance: Supervision Gait Distance (Feet): 150 Feet Assistive device: IV Pole Gait Pattern/deviations: Step-through pattern;Decreased stride length;Trunk flexed Gait velocity: decr   General Gait Details: for safety, cues for upright posture. Pt using IV pole for steadying assist.  Stairs             Wheelchair Mobility    Modified Rankin (Stroke Patients Only)       Balance Overall balance assessment: Modified Independent                                           Pertinent Vitals/Pain Pain Assessment: 0-10 Pain Score: 5  Pain Location: stomach Pain Descriptors / Indicators: Aching;Discomfort Pain Intervention(s): Limited activity within patient's tolerance;Monitored during session;Premedicated before session    Home Living Family/patient expects to be discharged to:: Private residence Living Arrangements: Children Available Help at Discharge: Family Type of Home: Apartment Home Access: Level entry     Home Layout: Two level Home Equipment: None      Prior Function Level of Independence: Independent         Comments: works from home     [place]: Right    Extremity/Trunk Assessment   Upper Extremity Assessment Upper Extremity Assessment: Overall WFL for tasks assessed    Lower Extremity Assessment Lower Extremity Assessment: Overall WFL for tasks assessed    Cervical / Trunk Assessment Cervical / Trunk Assessment: Other exceptions Cervical / Trunk Exceptions: s/p abdominal surgery, position of comfort is forward truncal flexion  Communication   Communication: No difficulties  Cognition Arousal/Alertness: Awake/alert Behavior During Therapy: WFL for tasks assessed/performed Overall Cognitive Status: Within Functional Limits for tasks assessed                                        General Comments General  comments (skin integrity, edema, etc.): L JP drain, half full with sanguinous fluid, RN notified    Exercises     Assessment/Plan    PT Assessment Patient needs continued PT services  PT Problem List Decreased mobility;Decreased knowledge of precautions;Decreased activity tolerance;Pain       PT Treatment Interventions Therapeutic activities;Gait training;Therapeutic  exercise;Patient/family education;Balance training;Stair training;Functional mobility training    PT Goals (Current goals can be found in the Care Plan section)  Acute Rehab PT Goals Patient Stated Goal: home PT Goal Formulation: With patient Time For Goal Achievement: 10/07/20 Potential to Achieve Goals: Good    Frequency Min 3X/week   Barriers to discharge        Co-evaluation               AM-PAC PT "6 Clicks" Mobility  Outcome Measure Help needed turning from your back to your side while in a flat bed without using bedrails?: None Help needed moving from lying on your back to sitting on the side of a flat bed without using bedrails?: None Help needed moving to and from a bed to a chair (including a wheelchair)?: A Little Help needed standing up from a chair using your arms (e.g., wheelchair or bedside chair)?: A Little Help needed to walk in hospital room?: A Little Help needed climbing 3-5 steps with a railing? : A Little 6 Click Score: 20    End of Session   Activity Tolerance: Patient tolerated treatment well Patient left: in bed;with call bell/phone within reach;with SCD's reapplied Nurse Communication: Mobility status PT Visit Diagnosis: Other abnormalities of gait and mobility (R26.89);Pain Pain - Right/Left:  (mid) Pain - part of body:  (abdomen)    Time: 1000-1022 PT Time Calculation (min) (ACUTE ONLY): 22 min   Charges:   PT Evaluation $PT Eval Low Complexity: 1 Low         [name] S, PT DPT Acute Rehabilitation Services Pager [phone]  Office [phone]   [name] 09/23/2020, 10:31 AM

## 2020-09-23 NOTE — Progress Notes (Signed)
    1 Day Post-Op  Subjective: No acute issues overnight. Afebrile, vitals stable. Drain output high but patient is hemodynamically stable. Pain well-controlled, denies nausea/vomiting. Good UOP.   Objective: Vital signs in last 24 hours: Temp:  [97 F (36.1 C)-99.2 F (37.3 C)] 99.2 F (37.3 C) (09/09 0333) Pulse Rate:  [65-100] 100 (09/09 0333) Resp:  [10-19] 18 (09/09 0333) BP: (104-158)/(77-111) 120/77 (09/09 0333) SpO2:  [92 %-100 %] 94 % (09/09 0333) Arterial Line BP: (95-171)/(65-84) 95/65 (09/08 1220) Last BM Date: 09/20/20  Intake/Output from previous day: 09/08 0701 - 09/09 0700 In: 2950 [I.V.:2700; IV Piggyback:250] Out: [id number] [Urine:2675; Drains:620; Blood:200] Intake/Output this shift: No intake/output data recorded.  PE: General: resting comfortably, NAD Neuro: alert and oriented, no focal deficits HEENT: NG in place, draining gastric contents Resp: normal work of breathing Abdomen: soft, nondistended, nontender to palpation. Incisions clean and dry with no erythema or induration. JP with serosanguinous drainage. Extremities: warm and well-perfused   Lab Results:  Recent Labs    09/22/20 1225 09/23/20 0247  WBC 14.0* 12.7*  HGB 12.2 9.2*  HCT 36.6 27.7*  PLT 224 183   BMET Recent Labs    09/22/20 1225 09/23/20 0247  NA 137 137  K 3.9 4.8  CL 106 105  CO2 23 26  GLUCOSE 133* 139*  BUN 8 13  CREATININE 0.67 0.81  CALCIUM 9.3 9.5   PT/INR No results for input(s): LABPROT, INR in the last 72 hours. CMP     Component Value Date/Time   NA 137 09/23/2020 0247   K 4.8 09/23/2020 0247   CL 105 09/23/2020 0247   CO2 26 09/23/2020 0247   GLUCOSE 139 (H) 09/23/2020 0247   BUN 13 09/23/2020 0247   CREATININE 0.81 09/23/2020 0247   CALCIUM 9.5 09/23/2020 0247   PROT 8.0 06/20/2018 0050   ALBUMIN 3.4 (L) 06/20/2018 0050   AST 23 06/20/2018 0050   ALT 29 06/20/2018 0050   ALKPHOS 79 06/20/2018 0050   BILITOT 0.6 06/20/2018 0050   GFRNONAA  >60 09/23/2020 0247   GFRAA >60 06/20/2018 0050   Lipase     Component Value Date/Time   LIPASE 49 06/20/2018 0050       Studies/Results: No results found.    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

## 2020-09-23 NOTE — Progress Notes (Signed)
RN removed Ngtube per order, intact. Foley removed also per order, intact.

## 2020-09-23 NOTE — Plan of Care (Signed)

## 2020-09-24 LAB — CBC
HCT: 19.6 % — ABNORMAL LOW (ref 36.0–46.0)
Hemoglobin: 6.5 g/dL — CL (ref 12.0–15.0)
MCH: 32.3 pg (ref 26.0–34.0)
MCHC: 33.2 g/dL (ref 30.0–36.0)
MCV: 97.5 fL (ref 80.0–100.0)
Platelets: 158 10*3/uL (ref 150–400)
RBC: 2.01 MIL/uL — ABNORMAL LOW (ref 3.87–5.11)
RDW: 12.9 % (ref 11.5–15.5)
WBC: 12.6 10*3/uL — ABNORMAL HIGH (ref 4.0–10.5)
nRBC: 0.2 % (ref 0.0–0.2)

## 2020-09-24 LAB — BASIC METABOLIC PANEL
Anion gap: 6 (ref 5–15)
BUN: 10 mg/dL (ref 6–20)
CO2: 27 mmol/L (ref 22–32)
Calcium: 9 mg/dL (ref 8.9–10.3)
Chloride: 104 mmol/L (ref 98–111)
Creatinine, Ser: 0.69 mg/dL (ref 0.44–1.00)
GFR, Estimated: >60 mL/min (ref >60)
Glucose, Bld: 120 mg/dL — ABNORMAL HIGH (ref 70–99)
Potassium: 3.4 mmol/L — ABNORMAL LOW (ref 3.5–5.1)
Sodium: 137 mmol/L (ref 135–145)

## 2020-09-24 LAB — GLUCOSE, CAPILLARY
Glucose-Capillary: 89 mg/dL (ref 70–99)
Glucose-Capillary: 140 mg/dL — ABNORMAL HIGH (ref 70–99)
Glucose-Capillary: 78 mg/dL (ref 70–99)
Glucose-Capillary: 95 mg/dL (ref 70–99)
Glucose-Capillary: 87 mg/dL (ref 70–99)
Glucose-Capillary: 107 mg/dL — ABNORMAL HIGH (ref 70–99)

## 2020-09-24 LAB — PREPARE RBC (CROSSMATCH)

## 2020-09-24 LAB — HEMOGLOBIN AND HEMATOCRIT, BLOOD
HCT: 25.1 % — ABNORMAL LOW (ref 36.0–46.0)
Hemoglobin: 8.6 g/dL — ABNORMAL LOW (ref 12.0–15.0)

## 2020-09-24 MED ORDER — SODIUM CHLORIDE 0.9% IV SOLUTION: Freq: Once | INTRAVENOUS | Status: DC

## 2020-09-24 NOTE — Progress Notes (Signed)
Physical Therapy Treatment Patient Details Name: [name] MRN: [id number] DOB: 07/05/1970 Today's Date: 09/24/2020    History of Present Illness 50 yo female s/p Laparoscopic distal pancreatectomy with splenectomy, excisional biopsy of gastric serosal nodule on 9/8. PMH includes depression, anxiety, hysterectomy.    PT Comments    Pt supine in bed on arrival this session. She did have a fall on 09/24/20 early am after drop oin HGB.  Pt s/p blood transfusion this pm and mobilizing well with no symptoms of dizziness.  Encouraged her to ambulate halls with spouse to improve OOB activity tolerance and decrease pain.  Pt was able to negotiate a full flight of stairs this pm.     Follow Up Recommendations  No PT follow up;Supervision for mobility/OOB     Equipment Recommendations  None recommended by PT    Recommendations for Other Services       Precautions / Restrictions Precautions Precautions: Other (comment) Precaution Comments: abdominal Restrictions Weight Bearing Restrictions: No    Mobility  Bed Mobility Overal bed mobility: Needs Assistance Bed Mobility: Rolling;Sidelying to Sit Rolling: Supervision         General bed mobility comments: cues for log roll technique, increased time secondary to pain.    Transfers Overall transfer level: Needs assistance Equipment used: None Transfers: Sit to/from Stand Sit to Stand: Supervision         General transfer comment: for safety, slow to rise.  Ambulation/Gait Ambulation/Gait assistance: Supervision Gait Distance (Feet): 200 Feet Assistive device: None Gait Pattern/deviations: Step-through pattern;Decreased stride length;Trunk flexed Gait velocity: decr   General Gait Details: Cues for reciprocal armswing and pacing.  reports pain but denies dizziness.   Stairs Stairs: Yes Stairs assistance: Supervision Stair Management: One rail Left Number of Stairs: 12 General stair comments: Cues for sequencing  and safety this session.  Pt able to negotiate a full flight.   Wheelchair Mobility    Modified Rankin (Stroke Patients Only)       Balance Overall balance assessment: Modified Independent                                          Cognition Arousal/Alertness: Awake/alert Behavior During Therapy: WFL for tasks assessed/performed Overall Cognitive Status: Within Functional Limits for tasks assessed                                        Exercises      General Comments        Pertinent Vitals/Pain Pain Assessment: 0-10 Pain Score: 5  Pain Location: stomach Pain Descriptors / Indicators: Aching;Discomfort Pain Intervention(s): Monitored during session;Repositioned    Home Living                      Prior Function            PT Goals (current goals can now be found in the care plan section) Acute Rehab PT Goals Patient Stated Goal: home Potential to Achieve Goals: Good Progress towards PT goals: Progressing toward goals    Frequency    Min 3X/week      PT Plan Current plan remains appropriate    Co-evaluation              AM-PAC PT "6 Clicks" Mobility  Outcome Measure  Help needed turning from your back to your side while in a flat bed without using bedrails?: None Help needed moving from lying on your back to sitting on the side of a flat bed without using bedrails?: None Help needed moving to and from a bed to a chair (including a wheelchair)?: A Little Help needed standing up from a chair using your arms (e.g., wheelchair or bedside chair)?: A Little Help needed to walk in hospital room?: A Little Help needed climbing 3-5 steps with a railing? : A Little 6 Click Score: 20    End of Session Equipment Utilized During Treatment: Gait belt Activity Tolerance: Patient tolerated treatment well Patient left: in bed;with call bell/phone within reach Nurse Communication: Mobility status (informed nursing  patient was safe to ambulate with her husband) PT Visit Diagnosis: Other abnormalities of gait and mobility (R26.89);Pain     Time: [id number] PT Time Calculation (min) (ACUTE ONLY): 11 min  Charges:  $Gait Training: 8-22 mins                     Erasmo Leventhal , PTA Acute Rehabilitation Services Pager [phone] Office [phone]    Dorwin Fitzhenry Eli Hose 09/24/2020, 1:26 PM

## 2020-09-24 NOTE — Progress Notes (Signed)
2 Days Post-Op   Subjective/Chief Complaint: Pt with no n/v tol PO Fell last night getting to BR, dizziness Low Hct this AM --> 2U PRBCs   Objective: Vital signs in last 24 hours: Temp:  [97.8 F (36.6 C)-99.2 F (37.3 C)] 98.8 F (37.1 C) (09/10 0630) Pulse Rate:  [90-108] 100 (09/10 0630) Resp:  [16-18] 18 (09/10 0630) BP: (101-158)/(65-96) 145/81 (09/10 0630) SpO2:  [95 %-100 %] 98 % (09/10 0630) Last BM Date: 09/20/20  Intake/Output from previous day: 09/09 0701 - 09/10 0700 In: 1082.3 [P.O.:360; I.V.:386.3; Blood:336] Out: 685 [Urine:125; Drains:560] Intake/Output this shift: No intake/output data recorded.  General appearance: alert and cooperative GI: soft, non-tender; bowel sounds normal; no masses,  no organomegaly and Jp SS  Lab Results:  Recent Labs    09/23/20 0247 09/24/20 0038  WBC 12.7* 12.6*  HGB 9.2* 6.5*  HCT 27.7* 19.6*  PLT 183 158   BMET Recent Labs    09/23/20 0247 09/24/20 0038  NA 137 137  K 4.8 3.4*  CL 105 104  CO2 26 27  GLUCOSE 139* 120*  BUN 13 10  CREATININE 0.81 0.69  CALCIUM 9.5 9.0   PT/INR No results for input(s): LABPROT, INR in the last 72 hours. ABG No results for input(s): PHART, HCO3 in the last 72 hours.  Invalid input(s): PCO2, PO2  Studies/Results: No results found.  Anti-infectives: Anti-infectives (From admission, onward)    Start     Dose/Rate Route Frequency Ordered Stop   09/22/20 0600  ceFAZolin (ANCEF) IVPB 2g/100 mL premix        2 g 200 mL/hr over 30 Minutes Intravenous On call to O.R. 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

## 2020-09-24 NOTE — Progress Notes (Signed)
MD on call notified hgb 6.5 critical result and that patient had a fall sustained no injuries. New orders received. Arthor Captain LPN

## 2020-09-24 NOTE — Progress Notes (Signed)
   09/24/20 0200  What Happened  Was fall witnessed? No  Was patient injured? No  Patient found on floor  Found by Staff-comment Hydrographic surveyor)  Stated prior activity to/from bed, chair, or stretcher  Follow Up  MD notified Dr. Dema Severin  Time MD notified [phone]  Additional tests No  Adult Fall Risk Assessment  Risk Factor Category (scoring not indicated) Fall has occurred during this admission (document High fall risk)  Age 50  Fall History: Fall within 6 months prior to admission 0  Elimination; Bowel and/or Urine Incontinence 0  Elimination; Bowel and/or Urine Urgency/Frequency 0  Medications: includes PCA/Opiates, Anti-convulsants, Anti-hypertensives, Diuretics, Hypnotics, Laxatives, Sedatives, and Psychotropics 5  Patient Care Equipment 2  Mobility-Assistance 2  Mobility-Gait 0  Mobility-Sensory Deficit 0  Altered awareness of immediate physical environment 0  Impulsiveness 0  Lack of understanding of one's physical/cognitive limitations 0  Total Score 9  Patient Fall Risk Level Moderate fall risk  Adult Fall Risk Interventions  Required Bundle Interventions *See Row Information* Moderate fall risk - low and moderate requirements implemented  Additional Interventions PT/OT need assessed if change in mobility from baseline;Use of appropriate toileting equipment (bedpan, BSC, etc.);HeadStart bed sensor (education/return demonstration)

## 2020-09-24 NOTE — Plan of Care (Signed)
  Problem: Education: Goal: Knowledge of General Education information will improve Description: Including pain rating scale, medication(s)/side effects and non-pharmacologic comfort measures 09/24/2020 0419 by Dorena Cookey, LPN Outcome: Progressing 09/24/2020 0351 by Dorena Cookey, LPN Outcome: Progressing   Problem: Health Behavior/Discharge Planning: Goal: Ability to manage health-related needs will improve 09/24/2020 0419 by Dorena Cookey, LPN Outcome: Progressing 09/24/2020 0351 by Dorena Cookey, LPN Outcome: Progressing   Problem: Clinical Measurements: Goal: Ability to maintain clinical measurements within normal limits will improve 09/24/2020 0419 by Dorena Cookey, LPN Outcome: Progressing 09/24/2020 0351 by Dorena Cookey, LPN Outcome: Progressing Goal: Will remain free from infection 09/24/2020 0419 by Dorena Cookey, LPN Outcome: Progressing 09/24/2020 0351 by Dorena Cookey, LPN Outcome: Progressing Goal: Diagnostic test results will improve 09/24/2020 0419 by Dorena Cookey, LPN Outcome: Progressing 09/24/2020 0351 by Dorena Cookey, LPN Outcome: Progressing Goal: Cardiovascular complication will be avoided 09/24/2020 0419 by Dorena Cookey, LPN Outcome: Progressing 09/24/2020 0351 by Dorena Cookey, LPN Outcome: Progressing   Problem: Clinical Measurements: Goal: Will remain free from infection 09/24/2020 0419 by Dorena Cookey, LPN Outcome: Progressing 09/24/2020 0351 by Dorena Cookey, LPN Outcome: Progressing   Problem: Clinical Measurements: Goal: Diagnostic test results will improve 09/24/2020 0419 by Dorena Cookey, LPN Outcome: Progressing 09/24/2020 0351 by Dorena Cookey, LPN Outcome: Progressing   Problem: Clinical Measurements: Goal: Cardiovascular complication will be avoided 09/24/2020 0419 by Dorena Cookey, LPN Outcome: Progressing 09/24/2020 0351 by Dorena Cookey, LPN Outcome:  Progressing   Problem: Activity: Goal: Risk for activity intolerance will decrease 09/24/2020 0419 by Dorena Cookey, LPN Outcome: Progressing 09/24/2020 0351 by Dorena Cookey, LPN Outcome: Progressing   Problem: Nutrition: Goal: Adequate nutrition will be maintained 09/24/2020 0419 by Dorena Cookey, LPN Outcome: Progressing 09/24/2020 0351 by Dorena Cookey, LPN Outcome: Progressing   Problem: Coping: Goal: Level of anxiety will decrease 09/24/2020 0419 by Dorena Cookey, LPN Outcome: Progressing 09/24/2020 0351 by Dorena Cookey, LPN Outcome: Progressing   Problem: Elimination: Goal: Will not experience complications related to bowel motility 09/24/2020 0419 by Dorena Cookey, LPN Outcome: Progressing 09/24/2020 0351 by Dorena Cookey, LPN Outcome: Progressing Goal: Will not experience complications related to urinary retention 09/24/2020 0419 by Dorena Cookey, LPN Outcome: Progressing 09/24/2020 0351 by Dorena Cookey, LPN Outcome: Progressing   Problem: Pain Managment: Goal: General experience of comfort will improve 09/24/2020 0419 by Dorena Cookey, LPN Outcome: Progressing 09/24/2020 0351 by Dorena Cookey, LPN Outcome: Progressing   Problem: Safety: Goal: Ability to remain free from injury will improve 09/24/2020 0419 by Dorena Cookey, LPN Outcome: Progressing 09/24/2020 0351 by Dorena Cookey, LPN Outcome: Progressing   Problem: Skin Integrity: Goal: Risk for impaired skin integrity will decrease 09/24/2020 0419 by Dorena Cookey, LPN Outcome: Progressing 09/24/2020 0351 by Dorena Cookey, LPN Outcome: Progressing

## 2020-09-24 NOTE — Plan of Care (Signed)
?  Problem: Education: ?Goal: Knowledge of General Education information will improve ?Description: Including pain rating scale, medication(s)/side effects and non-pharmacologic comfort measures ?Outcome: Progressing ?  ?Problem: Health Behavior/Discharge Planning: ?Goal: Ability to manage health-related needs will improve ?Outcome: Progressing ?  ?Problem: Clinical Measurements: ?Goal: Ability to maintain clinical measurements within normal limits will improve ?Outcome: Progressing ?Goal: Will remain free from infection ?Outcome: Progressing ?Goal: Diagnostic test results will improve ?Outcome: Progressing ?Goal: Cardiovascular complication will be avoided ?Outcome: Progressing ?  ?Problem: Nutrition: ?Goal: Adequate nutrition will be maintained ?Outcome: Progressing ?  ?Problem: Coping: ?Goal: Level of anxiety will decrease ?Outcome: Progressing ?  ?Problem: Elimination: ?Goal: Will not experience complications related to bowel motility ?Outcome: Progressing ?Goal: Will not experience complications related to urinary retention ?Outcome: Progressing ?  ?Problem: Pain Managment: ?Goal: General experience of comfort will improve ?Outcome: Progressing ?  ?Problem: Safety: ?Goal: Ability to remain free from injury will improve ?Outcome: Progressing ?  ?Problem: Skin Integrity: ?Goal: Risk for impaired skin integrity will decrease ?Outcome: Progressing ?  ?

## 2020-09-25 LAB — TYPE AND SCREEN
ABO/RH(D): O POS
Antibody Screen: NEGATIVE
Unit division: 0
Unit division: 0

## 2020-09-25 LAB — BPAM RBC
Blood Product Expiration Date: 202209302359
Blood Product Expiration Date: 202210012359
ISSUE DATE / TIME: 202209100324
ISSUE DATE / TIME: 202209100606
Unit Type and Rh: 5100
Unit Type and Rh: 5100

## 2020-09-25 LAB — GLUCOSE, CAPILLARY
Glucose-Capillary: 102 mg/dL — ABNORMAL HIGH (ref 70–99)
Glucose-Capillary: 103 mg/dL — ABNORMAL HIGH (ref 70–99)
Glucose-Capillary: 90 mg/dL (ref 70–99)
Glucose-Capillary: 127 mg/dL — ABNORMAL HIGH (ref 70–99)
Glucose-Capillary: 103 mg/dL — ABNORMAL HIGH (ref 70–99)

## 2020-09-25 LAB — BASIC METABOLIC PANEL
Anion gap: 5 (ref 5–15)
BUN: 7 mg/dL (ref 6–20)
CO2: 22 mmol/L (ref 22–32)
Calcium: 9 mg/dL (ref 8.9–10.3)
Chloride: 111 mmol/L (ref 98–111)
Creatinine, Ser: 0.6 mg/dL (ref 0.44–1.00)
GFR, Estimated: >60 mL/min (ref >60)
Glucose, Bld: 107 mg/dL — ABNORMAL HIGH (ref 70–99)
Potassium: 3.8 mmol/L (ref 3.5–5.1)
Sodium: 138 mmol/L (ref 135–145)

## 2020-09-25 LAB — CBC
HCT: 23.9 % — ABNORMAL LOW (ref 36.0–46.0)
Hemoglobin: 8.1 g/dL — ABNORMAL LOW (ref 12.0–15.0)
MCH: 31.3 pg (ref 26.0–34.0)
MCHC: 33.9 g/dL (ref 30.0–36.0)
MCV: 92.3 fL (ref 80.0–100.0)
Platelets: 165 10*3/uL (ref 150–400)
RBC: 2.59 MIL/uL — ABNORMAL LOW (ref 3.87–5.11)
RDW: 16.2 % — ABNORMAL HIGH (ref 11.5–15.5)
WBC: 14.8 10*3/uL — ABNORMAL HIGH (ref 4.0–10.5)
nRBC: 0.7 % — ABNORMAL HIGH (ref 0.0–0.2)

## 2020-09-25 NOTE — Progress Notes (Signed)
3 Days Post-Op   Subjective/Chief Complaint: Patient doing well today. No issues after transfusion yesterday Patient complains of soreness to the right side of the head status post fall Friday night.   Objective: Vital signs in last 24 hours: Temp:  [97.8 F (36.6 C)-99.1 F (37.3 C)] 98.7 F (37.1 C) (09/11 0412) Pulse Rate:  [93-103] 93 (09/11 0412) Resp:  [18] 18 (09/10 1600) BP: (116-119)/(73-96) 119/86 (09/11 0412) SpO2:  [98 %-100 %] 100 % (09/11 0412) Last BM Date: 09/20/20  Intake/Output from previous day: 09/10 0701 - 09/11 0700 In: 3445.2 [P.O.:1380; I.V.:1757.2; Blood:308] Out: 1020 [Urine:800; Drains:220] Intake/Output this shift: No intake/output data recorded.  General appearance: alert and cooperative GI: soft, non-tender; bowel sounds normal; no masses,  no organomegaly and JP serosanguineous, incisions clean dry and intact.  Lab Results:  Recent Labs    09/24/20 0038 09/24/20 1223 09/25/20 0120  WBC 12.6*  --  14.8*  HGB 6.5* 8.6* 8.1*  HCT 19.6* 25.1* 23.9*  PLT 158  --  165   BMET Recent Labs    09/24/20 0038 09/25/20 0120  NA 137 138  K 3.4* 3.8  CL 104 111  CO2 27 22  GLUCOSE 120* 107*  BUN 10 7  CREATININE 0.69 0.60  CALCIUM 9.0 9.0   PT/INR No results for input(s): LABPROT, INR in the last 72 hours. ABG No results for input(s): PHART, HCO3 in the last 72 hours.  Invalid input(s): PCO2, PO2  Studies/Results: No results found.  Anti-infectives: Anti-infectives (From admission, onward)    Start     Dose/Rate Route Frequency Ordered Stop   09/22/20 0600  ceFAZolin (ANCEF) IVPB 2g/100 mL premix        2 g 200 mL/hr over 30 Minutes Intravenous On call to O.R. 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

## 2020-09-26 LAB — CBC
HCT: 26.5 % — ABNORMAL LOW (ref 36.0–46.0)
Hemoglobin: 8.6 g/dL — ABNORMAL LOW (ref 12.0–15.0)
MCH: 30.9 pg (ref 26.0–34.0)
MCHC: 32.5 g/dL (ref 30.0–36.0)
MCV: 95.3 fL (ref 80.0–100.0)
Platelets: 238 10*3/uL (ref 150–400)
RBC: 2.78 MIL/uL — ABNORMAL LOW (ref 3.87–5.11)
RDW: 15.5 % (ref 11.5–15.5)
WBC: 14.9 10*3/uL — ABNORMAL HIGH (ref 4.0–10.5)
nRBC: 2.6 % — ABNORMAL HIGH (ref 0.0–0.2)

## 2020-09-26 LAB — GLUCOSE, CAPILLARY
Glucose-Capillary: 101 mg/dL — ABNORMAL HIGH (ref 70–99)
Glucose-Capillary: 101 mg/dL — ABNORMAL HIGH (ref 70–99)
Glucose-Capillary: 105 mg/dL — ABNORMAL HIGH (ref 70–99)

## 2020-09-26 MED ORDER — OXYCODONE HCL 5 MG PO TABS: 5.0000 mg | ORAL_TABLET | ORAL | Status: DC | PRN

## 2020-09-26 MED ORDER — ENSURE ENLIVE PO LIQD
237.0000 mL | Freq: Two times a day (BID) | ORAL | Status: DC
  Administered 2020-09-26 – 2020-09-27 (×3): 237 mL via ORAL

## 2020-09-26 MED ORDER — ENOXAPARIN SODIUM 40 MG/0.4ML IJ SOSY
40.0000 mg | PREFILLED_SYRINGE | Freq: Every day | INTRAMUSCULAR | Status: DC
  Administered 2020-09-26 – 2020-09-27 (×2): 40 mg via SUBCUTANEOUS
  Filled 2020-09-26 (×2): qty 0.4

## 2020-09-26 MED ORDER — MENINGOCOCCAL A C Y&W-135 OLIG IM SOLR
0.5000 mL | Freq: Once | INTRAMUSCULAR | Status: AC
  Administered 2020-09-26: 0.5 mL via INTRAMUSCULAR
  Filled 2020-09-26 (×2): qty 0.5

## 2020-09-26 MED ORDER — OXYCODONE HCL 5 MG PO TABS: 10.0000 mg | ORAL_TABLET | ORAL | Status: DC | PRN

## 2020-09-26 MED ORDER — HYDROMORPHONE HCL 1 MG/ML IJ SOLN: 0.5000 mg | INTRAMUSCULAR | Status: DC | PRN

## 2020-09-26 MED ORDER — OXYCODONE HCL 5 MG PO TABS
5.0000 mg | ORAL_TABLET | ORAL | Status: DC | PRN
  Administered 2020-09-26: 5 mg via ORAL
  Filled 2020-09-26: qty 1

## 2020-09-26 NOTE — Progress Notes (Signed)
    4 Days Post-Op  Subjective: Hgb remains stable at 8.6. Vitals stable. Drain output 70 mL overnight, no output charted for yesterday. Drain amylase not sent yesterday. Patient reports some nausea with dilaudid, no vomiting. Tolerating some soft food.   Objective: Vital signs in last 24 hours: Temp:  [97.7 F (36.5 C)-98.3 F (36.8 C)] 98 F (36.7 C) (09/12 0413) Pulse Rate:  [79-110] 84 (09/12 0413) Resp:  [15-18] 16 (09/12 0413) BP: (112-120)/(76-89) 118/81 (09/12 0413) SpO2:  [100 %] 100 % (09/12 0413) Last BM Date: 09/24/20  Intake/Output from previous day: 09/11 0701 - 09/12 0700 In: 680 [P.O.:680] Out: 70 [Drains:70] Intake/Output this shift: No intake/output data recorded.  PE: General: resting comfortably, NAD Neuro: alert and oriented, no focal deficits Resp: normal work of breathing on room air Abdomen: soft, nondistended, nontender to palpation. Incisions clean and dry with no erythema or induration. JP with serosanguinous drainage. Extremities: warm and well-perfused   Lab Results:  Recent Labs    09/25/20 0120 09/26/20 0321  WBC 14.8* 14.9*  HGB 8.1* 8.6*  HCT 23.9* 26.5*  PLT 165 238   BMET Recent Labs    09/24/20 0038 09/25/20 0120  NA 137 138  K 3.4* 3.8  CL 104 111  CO2 27 22  GLUCOSE 120* 107*  BUN 10 7  CREATININE 0.69 0.60  CALCIUM 9.0 9.0   PT/INR No results for input(s): LABPROT, INR in the last 72 hours. CMP     Component Value Date/Time   NA 138 09/25/2020 0120   K 3.8 09/25/2020 0120   CL 111 09/25/2020 0120   CO2 22 09/25/2020 0120   GLUCOSE 107 (H) 09/25/2020 0120   BUN 7 09/25/2020 0120   CREATININE 0.60 09/25/2020 0120   CALCIUM 9.0 09/25/2020 0120   PROT 8.0 06/20/2018 0050   ALBUMIN 3.4 (L) 06/20/2018 0050   AST 23 06/20/2018 0050   ALT 29 06/20/2018 0050   ALKPHOS 79 06/20/2018 0050   BILITOT 0.6 06/20/2018 0050   GFRNONAA >60 09/25/2020 0120   GFRAA >60 06/20/2018 0050   Lipase     Component Value  Date/Time   LIPASE 49 06/20/2018 0050       Studies/Results: No results found.  Anti-infectives: Anti-infectives (From admission, onward)    Start     Dose/Rate Route Frequency Ordered Stop   09/22/20 0600  ceFAZolin (ANCEF) IVPB 2g/100 mL premix        2 g 200 mL/hr over 30 Minutes Intravenous On call to O.R. 09/22/20 [id number] 09/22/20 0747   09/22/20 0555  ceFAZolin (ANCEF) 2-4 GM/100ML-% IVPB       Note to Pharmacy: Mendel Corning   : cabinet override      09/22/20 0555 09/22/20 0757        Assessment/Plan 50 yo female with enlarging pancreatic cystic neoplasm, POD4 s/p laparoscopic distal pancreatectomy and splenectomy. - Continue soft diet, SLIV - Discontinue SSI and glc checks (no insulin requirements) - Send drain amylase this morning - Multimodal pain control, discontinue IV dilaudid - Ambulate, IS, pulmonary toilet - VTE: resume lovenox '40mg'$  daily this morning, SCDs     LOS: 4 days    Michaelle Birks, MD [facility] General, Hepatobiliary and Pancreatic Surgery 09/26/20 7:01 AM

## 2020-09-27 MED ORDER — ONDANSETRON 4 MG PO TBDP: 4.0000 mg | ORAL_TABLET | Freq: Four times a day (QID) | ORAL | 0 refills | Status: AC | PRN

## 2020-09-27 MED ORDER — OXYCODONE HCL 5 MG PO TABS: 5.0000 mg | ORAL_TABLET | ORAL | 0 refills | Status: DC | PRN

## 2020-09-27 MED ORDER — METHOCARBAMOL 500 MG PO TABS: 500.0000 mg | ORAL_TABLET | Freq: Four times a day (QID) | ORAL | 0 refills | Status: AC | PRN

## 2020-09-27 MED ORDER — ACETAMINOPHEN 500 MG PO TABS: 1000.0000 mg | ORAL_TABLET | Freq: Three times a day (TID) | ORAL | 0 refills | Status: AC | PRN

## 2020-09-27 MED ORDER — BISACODYL 10 MG RE SUPP
10.0000 mg | Freq: Once | RECTAL | Status: AC
  Administered 2020-09-27: 10 mg via RECTAL
  Filled 2020-09-27: qty 1

## 2020-09-27 NOTE — Progress Notes (Addendum)
Education on JP drain emptying provided to patient and friend at bedside. They verbalized understanding with teach back. Dressing also changed. Discharge summary reviewed. All questions were answered.  10:56 Suppository inserted via order. Awaiting result.

## 2020-09-27 NOTE — Discharge Instructions (Addendum)
[facility] [place] [facility] DISCHARGE INSTRUCTIONS  Activity No heavy lifting greater than 15 pounds for 4 weeks after surgery. Ok to shower, but do not bathe or submerge incision underwater. Do not drive while taking narcotic pain medication. Be sure to walk around your home at least 3 times daily. This will help avoid blood clots and will improve your strength.  Wound Care Your incisions are covered with skin glue called Dermabond. This will peel off on its own over time. You may shower and allow warm soapy water to run over your incisions. Gently pat dry. Do not submerge your incision underwater. Monitor your incision for any new redness, tenderness, or drainage.  Medications You should take Tylenol 4 times daily for pain. If you have more severe pain that is not covered by Tylenol, you may take oxycodone up to every 6 hours as needed. Please reserve this medication only for the most severe pain.  Diet You may notice that you feel full early after meals, or have occasional nausea or decreased appetite. This is common following pancreas surgery and will improve with time. It is better to consume small frequent meals throughout the day rather than 3 large meals. Avoid drinking a lot of carbonated beverages, as these can worsen symptoms of nausea and reflux. If you are having frequent vomiting after meals or are unable to keep down any food or drink, please call the office right away.  When to Call us: Fever greater than 100.5 New redness, drainage, or swelling at incision site Severe pain, nausea, or vomiting Excessive vomiting or inability to keep down any liquids Jaundice (yellowing of the whites of the eyes or skin)  Follow-up You will have a drain check within the next week - Dr. Zenia Resides will call you once the results of your drain labs are available. You have an appointment scheduled with Dr. Zenia Resides on October 12, 2020 at 9:30am. This will be at the [facility] office  at [address] [place], [place], [place]. Please arrive at least 15 minutes prior to your scheduled appointment time.   JP DRAIN CARE INSTRUCTIONS Wash your hands prior to caring for your drain. Uncap the bulb to release the suction. Pour out the bulb contents into a measuring cup and record the amount. Squeeze the bulb and replace the cap to apply suction again. You should empty your drain each time you notice the bulb is full. Empty at least once a day and more often when the bulb fills up.  Please keep a daily log of your drain output and bring this with you when you come to clinic.  For questions or concerns, please call the office at [phone].

## 2020-09-27 NOTE — Progress Notes (Signed)
PT Cancellation Note  Patient Details Name: [name] MRN: [id number] DOB: 1970/03/29   Cancelled Treatment:    Reason Eval/Treat Not Completed: Other (comment) (Pt declined as she is going home.)   [name] 09/27/2020, 11:24 AM Fremon Zacharia M,PT Acute Rehab Services [phone] (pager)

## 2020-09-28 LAB — MISC LABCORP TEST (SEND OUT): Labcorp test code: 9985

## 2020-09-28 NOTE — Discharge Summary (Signed)
Physician Discharge Summary   Patient ID: [name] [id number] 50 y.o. 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  EUS was performed but FNA was not possible due to the presence of a large blood vessel.  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  The surgical resection via laparoscopic distal pancreatectomy was recommended.  Hospital Course: Patient was taken to the operating room on 09/22/2020 for a laparoscopic distal pancreatectomy with splenectomy.  For further details of this procedure please see separately dictated operative note.  Postoperatively she was admitted to the progressive care unit in stable condition.  On postop day 1 her NG tube was removed and she was advanced to clear liquid diet.  Foley was removed and she was able to void spontaneously.  Overnight on postop day 1 she had an episode of dizziness and hemoglobin was noted to be 6.5.  She was transfused 2 units PRBCs, after which her hemoglobin rose appropriately.  She remained hemodynamically stable with no further signs of bleeding.  She was advanced to full liquid diet on postop day 2 and a soft diet on postop day 3.  She worked with physical therapy and was able to ambulate without difficulty.  By postop day 4 she had had return of bowel function.  She had a drain amylase sent on postop day 4 which was still pending at the time of discharge.  Her drain output remains serosanguineous.  On postop day 5 she was ambulating, tolerating solid food, and having bowel function.  Her  pain was well controlled on oral medications.  She was examined and deemed appropriate for discharge home, with her surgical drain to remain in place at discharge.  She will follow-up later this week for drain check after her drain amylase results.  Surgical pathology was also pending at the time of discharge.  Prior to surgery she received her pneumococcal and HiB vaccines.  She was given the first meningococcal vaccine (Menveo) during her admission prior to discharge.  Consults: None  Significant Diagnostic Studies: N/A  Treatments: IV hydration, analgesia: acetaminophen, Dilaudid, and oxycodone, therapies: PT, and surgery: Laparoscopic distal pancreatectomy with splenectomy  Discharge Exam: General: resting comfortably, NAD Neuro: alert and oriented, no focal deficits Resp: normal work of breathing on room air CV: Extremities warm and well-perfused Abdomen: soft, nondistended, nontender to palpation. Incisions clean and dry with no erythema, induration, or drainage.  Left upper quadrant JP drain with serosanguineous drainage.   Disposition: Discharge disposition: 01-Home or Self Care       Patient Instructions:  Allergies as of 09/27/2020   No Known Allergies      Medication List     TAKE these medications    acetaminophen 500 MG tablet Commonly known as: TYLENOL Take 2 tablets (1,000 mg total) by mouth every 8 (eight) hours as needed for mild pain.   docusate sodium 100 MG capsule Commonly known as: COLACE Take 100 mg by mouth daily as needed for moderate constipation.   ibuprofen 200 MG tablet Commonly known as: ADVIL Take 400 mg by mouth every 6 (six)  hours as needed for headache or moderate pain.   methocarbamol 500 MG tablet Commonly known as: ROBAXIN Take 1 tablet (500 mg total) by mouth every 6 (six) hours as needed for muscle spasms.   ondansetron 4 MG disintegrating tablet Commonly known as: ZOFRAN-ODT Take 1 tablet (4 mg total) by mouth every 6 (six)  hours as needed for nausea.   oxyCODONE 5 MG immediate release tablet Commonly known as: Oxy IR/ROXICODONE Take 1 tablet (5 mg total) by mouth every 4 (four) hours as needed for severe pain.   valACYclovir 500 MG tablet Commonly known as: VALTREX Take 500 mg by mouth 2 (two) times daily as needed (outbreaks).   Vitamin D (Ergocalciferol) 1.25 MG (50000 UNIT) Caps capsule Commonly known as: DRISDOL Take 50,000 Units by mouth 3 days.       Activity: no heavy lifting for 6 weeks Diet: regular diet Wound Care: keep wound clean and dry  Follow-up with Dr. Zenia Resides in 2 weeks. Will call for drain check later this week when drain amylase results.  Signed: Dwan Bolt 09/28/2020 9:58 AM

## 2020-09-29 LAB — SURGICAL PATHOLOGY

## 2020-10-05 ENCOUNTER — Other Ambulatory Visit: Payer: Self-pay

## 2020-10-05 NOTE — Progress Notes (Signed)
The proposed treatment discussed in conference is for discussion purpose only and is not a binding recommendation.  The patients have not been physically examined, or presented with their treatment options.  Therefore, final treatment plans cannot be decided.  

## 2020-10-06 ENCOUNTER — Other Ambulatory Visit: Payer: Self-pay | Admitting: Surgery

## 2020-10-06 ENCOUNTER — Other Ambulatory Visit (HOSPITAL_COMMUNITY): Payer: Self-pay | Admitting: Surgery

## 2020-10-06 ENCOUNTER — Other Ambulatory Visit: Payer: Self-pay

## 2020-10-06 ENCOUNTER — Encounter (HOSPITAL_COMMUNITY): Payer: Self-pay | Admitting: Surgery

## 2020-10-06 ENCOUNTER — Ambulatory Visit (HOSPITAL_COMMUNITY)
Admission: RE | Admit: 2020-10-06 | Discharge: 2020-10-06 | Disposition: A | Discharge status: Home / Self Care | Payer: Medicaid Other | Source: Ambulatory Visit | Attending: Surgery | Admitting: Surgery

## 2020-10-06 ENCOUNTER — Inpatient Hospital Stay (HOSPITAL_COMMUNITY)
Admission: AD | Admit: 2020-10-06 | Discharge: 2020-10-09 | DRG: 947 | Disposition: A | Discharge status: Home / Self Care | Payer: Medicaid Other | Source: Ambulatory Visit | Attending: Surgery | Admitting: Surgery

## 2020-10-06 DIAGNOSIS — Z9081 Acquired absence of spleen: Secondary | ICD-10-CM

## 2020-10-06 DIAGNOSIS — Z818 Family history of other mental and behavioral disorders: Secondary | ICD-10-CM

## 2020-10-06 DIAGNOSIS — F32A Depression, unspecified: Secondary | ICD-10-CM | POA: Diagnosis present

## 2020-10-06 DIAGNOSIS — Z90411 Acquired partial absence of pancreas: Secondary | ICD-10-CM

## 2020-10-06 DIAGNOSIS — K8020 Calculus of gallbladder without cholecystitis without obstruction: Secondary | ICD-10-CM | POA: Diagnosis present

## 2020-10-06 DIAGNOSIS — Z8249 Family history of ischemic heart disease and other diseases of the circulatory system: Secondary | ICD-10-CM

## 2020-10-06 DIAGNOSIS — K859 Acute pancreatitis without necrosis or infection, unspecified: Secondary | ICD-10-CM | POA: Diagnosis present

## 2020-10-06 DIAGNOSIS — Z833 Family history of diabetes mellitus: Secondary | ICD-10-CM

## 2020-10-06 DIAGNOSIS — Z20822 Contact with and (suspected) exposure to covid-19: Secondary | ICD-10-CM | POA: Diagnosis present

## 2020-10-06 DIAGNOSIS — R110 Nausea: Secondary | ICD-10-CM | POA: Diagnosis present

## 2020-10-06 DIAGNOSIS — R1030 Lower abdominal pain, unspecified: Secondary | ICD-10-CM

## 2020-10-06 DIAGNOSIS — R188 Other ascites: Principal | ICD-10-CM | POA: Diagnosis present

## 2020-10-06 DIAGNOSIS — K8689 Other specified diseases of pancreas: Secondary | ICD-10-CM | POA: Diagnosis present

## 2020-10-06 DIAGNOSIS — F419 Anxiety disorder, unspecified: Secondary | ICD-10-CM | POA: Diagnosis present

## 2020-10-06 DIAGNOSIS — C49A0 Gastrointestinal stromal tumor, unspecified site: Secondary | ICD-10-CM | POA: Diagnosis present

## 2020-10-06 LAB — CBC
HCT: 31.7 % — ABNORMAL LOW (ref 36.0–46.0)
Hemoglobin: 10.3 g/dL — ABNORMAL LOW (ref 12.0–15.0)
MCH: 30.8 pg (ref 26.0–34.0)
MCHC: 32.5 g/dL (ref 30.0–36.0)
MCV: 94.9 fL (ref 80.0–100.0)
Platelets: 888 10*3/uL — ABNORMAL HIGH (ref 150–400)
RBC: 3.34 MIL/uL — ABNORMAL LOW (ref 3.87–5.11)
RDW: 15.1 % (ref 11.5–15.5)
WBC: 28.6 10*3/uL — ABNORMAL HIGH (ref 4.0–10.5)
nRBC: 0.1 % (ref 0.0–0.2)

## 2020-10-06 LAB — COMPREHENSIVE METABOLIC PANEL
ALT: 31 U/L (ref 0–44)
AST: 43 U/L — ABNORMAL HIGH (ref 15–41)
Albumin: 3.9 g/dL (ref 3.5–5.0)
Alkaline Phosphatase: 113 U/L (ref 38–126)
Anion gap: 9 (ref 5–15)
BUN: 10 mg/dL (ref 6–20)
CO2: 26 mmol/L (ref 22–32)
Calcium: 11 mg/dL — ABNORMAL HIGH (ref 8.9–10.3)
Chloride: 97 mmol/L — ABNORMAL LOW (ref 98–111)
Creatinine, Ser: 0.67 mg/dL (ref 0.44–1.00)
GFR, Estimated: >60 mL/min (ref >60)
Glucose, Bld: 120 mg/dL — ABNORMAL HIGH (ref 70–99)
Potassium: 4.2 mmol/L (ref 3.5–5.1)
Sodium: 132 mmol/L — ABNORMAL LOW (ref 135–145)
Total Bilirubin: 1.5 mg/dL — ABNORMAL HIGH (ref 0.3–1.2)
Total Protein: 8.2 g/dL — ABNORMAL HIGH (ref 6.5–8.1)

## 2020-10-06 LAB — PROTIME-INR
INR: 1.3 — ABNORMAL HIGH (ref 0.8–1.2)
Prothrombin Time: 15.7 seconds — ABNORMAL HIGH (ref 11.4–15.2)

## 2020-10-06 MED ORDER — MUPIROCIN 2 % EX OINT
1.0000 | TOPICAL_OINTMENT | Freq: Two times a day (BID) | CUTANEOUS | Status: DC
Start: 2020-10-07 — End: 2020-10-09
  Administered 2020-10-08 – 2020-10-09 (×3): 1 via NASAL
  Filled 2020-10-06 (×3): qty 22

## 2020-10-06 MED ORDER — ONDANSETRON 4 MG PO TBDP: 4.0000 mg | ORAL_TABLET | Freq: Four times a day (QID) | ORAL | Status: DC | PRN

## 2020-10-06 MED ORDER — HYDROMORPHONE HCL 1 MG/ML IJ SOLN
0.5000 mg | INTRAMUSCULAR | Status: DC | PRN
  Administered 2020-10-06 – 2020-10-09 (×9): 0.5 mg via INTRAVENOUS
  Filled 2020-10-06 (×9): qty 0.5

## 2020-10-06 MED ORDER — ONDANSETRON HCL 4 MG/2ML IJ SOLN: 4.0000 mg | Freq: Four times a day (QID) | INTRAMUSCULAR | Status: DC | PRN

## 2020-10-06 MED ORDER — IOHEXOL 350 MG/ML SOLN
100.0000 mL | Freq: Once | INTRAVENOUS | Status: AC | PRN
  Administered 2020-10-06: 80 mL via INTRAVENOUS

## 2020-10-06 MED ORDER — DIPHENHYDRAMINE HCL 12.5 MG/5ML PO ELIX
12.5000 mg | ORAL_SOLUTION | Freq: Four times a day (QID) | ORAL | Status: DC | PRN
  Administered 2020-10-07 – 2020-10-08 (×2): 12.5 mg via ORAL
  Filled 2020-10-06 (×2): qty 5

## 2020-10-06 MED ORDER — ACETAMINOPHEN 325 MG PO TABS: 650.0000 mg | ORAL_TABLET | Freq: Four times a day (QID) | ORAL | Status: DC | PRN

## 2020-10-06 MED ORDER — LACTATED RINGERS IV SOLN: INTRAVENOUS | Status: DC

## 2020-10-06 MED ORDER — OXYCODONE HCL 5 MG PO TABS
5.0000 mg | ORAL_TABLET | ORAL | Status: DC | PRN
  Administered 2020-10-06 – 2020-10-08 (×8): 5 mg via ORAL
  Filled 2020-10-06 (×8): qty 1

## 2020-10-06 MED ORDER — DOCUSATE SODIUM 100 MG PO CAPS
100.0000 mg | ORAL_CAPSULE | Freq: Two times a day (BID) | ORAL | Status: DC
  Administered 2020-10-07 – 2020-10-09 (×4): 100 mg via ORAL
  Filled 2020-10-06 (×4): qty 1

## 2020-10-06 MED ORDER — DIPHENHYDRAMINE HCL 50 MG/ML IJ SOLN
12.5000 mg | Freq: Four times a day (QID) | INTRAMUSCULAR | Status: DC | PRN
  Administered 2020-10-07 – 2020-10-09 (×3): 12.5 mg via INTRAVENOUS
  Filled 2020-10-06 (×3): qty 1

## 2020-10-06 MED ORDER — IOHEXOL 9 MG/ML PO SOLN
ORAL | Status: AC
  Filled 2020-10-06: qty 1000

## 2020-10-06 MED ORDER — IOHEXOL 9 MG/ML PO SOLN: 1000.0000 mL | ORAL | Status: AC

## 2020-10-06 NOTE — H&P (Signed)
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  She was discharged home with her drain in place for a postoperative pancreatic fistula.  Of note she did have a bleeding episode on postop day 2 which required transfusion of 2 units PRBCs, although she remained hemodynamically stable.  She came to clinic yesterday for a drain check as her output was very low at 5 to 10 mL/day for several days.  The drain was removed, and afterward she began having crampy left-sided and lower abdominal pain.  Vitals remained stable.  She had phone follow-up this morning, and reported ongoing crampy abdominal pain as well as nausea with poor p.o. intake.  She was sent for an outpatient CT scan which resulted this afternoon.  The scan showed a large fluid collection adjacent to the remnant pancreas.  She was direct admitted.   Reports no appetite currently. +N/v with attempted PO intake. Low grade temps at home - reports it was 45F yesterday. Some loose stool, no bloating or constipation.   Past Medical History:  Diagnosis Date   Anxiety    Depression    history - no current prob - no meds    Past Surgical History:  Procedure Laterality Date   CESAREAN SECTION     x 4   CYSTOSCOPY  12/13/2010   Procedure: CYSTOSCOPY;  Surgeon: Eldred Manges, MD;  Location: [facility];  Service: Gynecology;;   ESOPHAGOGASTRODUODENOSCOPY (EGD) WITH PROPOFOL N/A 04/26/2020   Procedure: ESOPHAGOGASTRODUODENOSCOPY (EGD) WITH PROPOFOL;  Surgeon: Arta Silence, MD;  Location: [facility];  Service: Endoscopy;  Laterality: N/A;   EYE MUSCLE SURGERY     as child - right eye   HYSTERECTOMY ABDOMINAL WITH SALPINGECTOMY  11/2010   LAPAROSCOPIC SPLENECTOMY N/A 09/22/2020   Procedure: LAPAROSCOPIC SPLENECTOMY;  Surgeon: Dwan Bolt, MD;  Location: [facility];  Service: General;  Laterality: N/A;    PANCREATECTOMY N/A 09/22/2020   Procedure: LAPAROSCOPIC DISTAL PANCREATECTOMY;  Surgeon: Dwan Bolt, MD;  Location: [facility];  Service: General;  Laterality: N/A;  GEN AND TAP BLOCK   TUBAL LIGATION     UPPER ESOPHAGEAL ENDOSCOPIC ULTRASOUND (EUS) N/A 04/26/2020   Procedure: UPPER ESOPHAGEAL ENDOSCOPIC ULTRASOUND (EUS) Linear;  Surgeon: Arta Silence, MD;  Location: [facility];  Service: Endoscopy;  Laterality: N/A;    Family History  Problem Relation Age of Onset   Diabetes Mother    Diabetes Father    Diabetes Brother    Cancer Brother    Hypertension Brother    Diabetes Brother    Bipolar disorder Brother     Social:  reports that she has never smoked. She has never used smokeless tobacco. She reports current alcohol use. She reports that she does not use drugs.  Allergies: No Known Allergies  Medications: I have reviewed the patient's current medications.  Results for orders placed or performed during the hospital encounter of 10/06/20 (from the past 48 hour(s))  Comprehensive metabolic panel     Status: Abnormal   Collection Time: 10/06/20  7:22 PM  Result Value Ref Range   Sodium 132 (L) 135 - 145 mmol/L   Potassium 4.2 3.5 - 5.1 mmol/L   Chloride 97 (L) 98 - 111 mmol/L   CO2 26 22 - 32 mmol/L   Glucose, Bld 120 (H) 70 - 99 mg/dL    Comment: Glucose reference  range applies only to samples taken after fasting for at least 8 hours.   BUN 10 6 - 20 mg/dL   Creatinine, Ser 0.67 0.44 - 1.00 mg/dL   Calcium 11.0 (H) 8.9 - 10.3 mg/dL   Total Protein 8.2 (H) 6.5 - 8.1 g/dL   Albumin 3.9 3.5 - 5.0 g/dL   AST 43 (H) 15 - 41 U/L   ALT 31 0 - 44 U/L   Alkaline Phosphatase 113 38 - 126 U/L   Total Bilirubin 1.5 (H) 0.3 - 1.2 mg/dL   GFR, Estimated >60 >60 mL/min    Comment: (NOTE) Calculated using the CKD-EPI Creatinine Equation (2021)    Anion gap 9 5 - 15    Comment: Performed at [facility], [place] [address] [place], [place] [address]  Protime-INR      Status: Abnormal   Collection Time: 10/06/20  7:22 PM  Result Value Ref Range   Prothrombin Time 15.7 (H) 11.4 - 15.2 seconds   INR 1.3 (H) 0.8 - 1.2    Comment: (NOTE) INR goal varies based on device and disease states. Performed at [facility], [place] [address] [place], [place] [address]   CBC     Status: Abnormal   Collection Time: 10/06/20  7:22 PM  Result Value Ref Range   WBC 28.6 (H) 4.0 - 10.5 K/uL    Comment: REPEATED TO VERIFY Ranesha Val COUNT CONFIRMED ON SMEAR    RBC 3.34 (L) 3.87 - 5.11 MIL/uL   Hemoglobin 10.3 (L) 12.0 - 15.0 g/dL   HCT 31.7 (L) 36.0 - 46.0 %   MCV 94.9 80.0 - 100.0 fL   MCH 30.8 26.0 - 34.0 pg   MCHC 32.5 30.0 - 36.0 g/dL   RDW 15.1 11.5 - 15.5 %   Platelets 888 (H) 150 - 400 K/uL    Comment: REPEATED TO VERIFY   nRBC 0.1 0.0 - 0.2 %    Comment: Performed at [facility], [place] [address] [place], [place] [address]    CT ABDOMEN PELVIS W CONTRAST  Result Date: 10/06/2020 CLINICAL DATA:  Laparoscopic distal pancreatectomy and splenectomy 14 days ago. Surgical drain pulled yesterday. Left-sided pain. Nausea and vomiting. EXAM: CT ABDOMEN AND PELVIS WITH CONTRAST TECHNIQUE: Multidetector CT imaging of the abdomen and pelvis was performed using the standard protocol following bolus administration of intravenous contrast. CONTRAST:  57mL OMNIPAQUE IOHEXOL 350 MG/ML SOLN COMPARISON:  06/20/2018 CT.  MRI 02/27/2020. FINDINGS: Lower chest: Left base dependent atelectasis. Normal heart size without pericardial or pleural effusion. Hepatobiliary: Normal liver. Stone filled gallbladder without acute cholecystitis or biliary duct dilatation. Pancreas: Normal pancreatic head neck. Status post distal pancreatectomy. Complex fluid collection within the operative bed, measures 9.0 x 4.9 cm transverse on 22/2. 6.7 cm craniocaudal on coronal image 33. A contiguous portion extending into the jejunal mesentery measures 3.7 x 4.7 by 4.6  cm including on 43/5. Spleen: Splenectomy. Small volume ill-defined fluid without significant mass effect in the splenectomy bed including on 13/2. Adrenals/Urinary Tract: Normal right adrenal gland. The left adrenal is likely compressed by the above described complex fluid collection. Normal kidneys, without hydronephrosis. Normal urinary bladder. Stomach/Bowel: Tiny hiatal hernia. Gastric body displaced and compressed by the lesser sac fluid collection. Normal colon and terminal ileum. Normal small bowel. Vascular/Lymphatic: Aortic atherosclerosis. Patent portal and hepatic veins. No abdominopelvic adenopathy. Reproductive: At least partial hysterectomy.  No adnexal mass. Other: Trace complex fluid/hemorrhage in the cul-de-sac on 71/2. Mild edema and  ill-defined fluid adjacent the transverse colon on 27/2. No free intraperitoneal air. Musculoskeletal: No acute osseous abnormality. IMPRESSION: 1. Status post distal pancreatectomy and splenectomy. Complex fluid collection within the operative bed, favor hematoma. Complication of pancreatitis (early pseudocyst) could look similar. 2. Mass-effect upon the stomach from the above collection, without high-grade obstruction. 3. Cholelithiasis. 4. Trace complex fluid, likely hemorrhage in the cul-de-sac. 5.  Aortic Atherosclerosis (ICD10-I70.0). 6. These results will be called to the ordering clinician or representative by the Radiologist Assistant, and communication documented in the PACS or [employer]. Electronically Signed   By: Abigail Miyamoto M.D.   On: 10/06/2020 16:59    ROS - all of the below systems have been reviewed with the patient and positives are indicated with bold text General: chills, fever or night sweats Eyes: blurry vision or double vision ENT: epistaxis or sore throat Allergy/Immunology: itchy/watery eyes or nasal congestion Hematologic/Lymphatic: bleeding problems, blood clots or swollen lymph nodes Endocrine: temperature intolerance or  unexpected weight changes Breast: new or changing breast lumps or nipple discharge Resp: cough, shortness of breath, or wheezing CV: chest pain or dyspnea on exertion GI: as per HPI GU: dysuria, trouble voiding, or hematuria MSK: joint pain or joint stiffness Neuro: TIA or stroke symptoms Derm: pruritus and skin lesion changes Psych: anxiety and depression  PE Blood pressure 129/80, pulse 90, temperature 98.7 F (37.1 C), temperature source Oral, resp. rate 17, last menstrual period 11/24/2010, SpO2 100 %. Constitutional: NAD; conversant; wearing mask Eyes: Moist conjunctiva; no lid lag; anicteric; Neck: Trachea midline Lungs: Normal respiratory effort; CV: RRR; no pitting edema GI: Incisions well healed. Drain site dressed and dry. Abd soft, minimal tenderness in MEG; non distended; no palpable hepatomegaly MSK: Normal range of motion of extremities Psychiatric: Appropriate affect; alert and oriented x3  Results for orders placed or performed during the hospital encounter of 10/06/20 (from the past 48 hour(s))  Comprehensive metabolic panel     Status: Abnormal   Collection Time: 10/06/20  7:22 PM  Result Value Ref Range   Sodium 132 (L) 135 - 145 mmol/L   Potassium 4.2 3.5 - 5.1 mmol/L   Chloride 97 (L) 98 - 111 mmol/L   CO2 26 22 - 32 mmol/L   Glucose, Bld 120 (H) 70 - 99 mg/dL    Comment: Glucose reference range applies only to samples taken after fasting for at least 8 hours.   BUN 10 6 - 20 mg/dL   Creatinine, Ser 0.67 0.44 - 1.00 mg/dL   Calcium 11.0 (H) 8.9 - 10.3 mg/dL   Total Protein 8.2 (H) 6.5 - 8.1 g/dL   Albumin 3.9 3.5 - 5.0 g/dL   AST 43 (H) 15 - 41 U/L   ALT 31 0 - 44 U/L   Alkaline Phosphatase 113 38 - 126 U/L   Total Bilirubin 1.5 (H) 0.3 - 1.2 mg/dL   GFR, Estimated >60 >60 mL/min    Comment: (NOTE) Calculated using the CKD-EPI Creatinine Equation (2021)    Anion gap 9 5 - 15    Comment: Performed at [facility], [place] [address], [place], [place] [address]  Protime-INR     Status: Abnormal   Collection Time: 10/06/20  7:22 PM  Result Value Ref Range   Prothrombin Time 15.7 (H) 11.4 - 15.2 seconds   INR 1.3 (H) 0.8 - 1.2    Comment: (NOTE) INR goal varies based on device and disease states. Performed at [facility], [place]  [address], [place], [place] [address]   CBC     Status: Abnormal   Collection Time: 10/06/20  7:22 PM  Result Value Ref Range   WBC 28.6 (H) 4.0 - 10.5 K/uL    Comment: REPEATED TO VERIFY Demarkus Remmel COUNT CONFIRMED ON SMEAR    RBC 3.34 (L) 3.87 - 5.11 MIL/uL   Hemoglobin 10.3 (L) 12.0 - 15.0 g/dL   HCT 31.7 (L) 36.0 - 46.0 %   MCV 94.9 80.0 - 100.0 fL   MCH 30.8 26.0 - 34.0 pg   MCHC 32.5 30.0 - 36.0 g/dL   RDW 15.1 11.5 - 15.5 %   Platelets 888 (H) 150 - 400 K/uL    Comment: REPEATED TO VERIFY   nRBC 0.1 0.0 - 0.2 %    Comment: Performed at [facility], [place] [address] [place], [place] [address]    CT ABDOMEN PELVIS W CONTRAST  Result Date: 10/06/2020 CLINICAL DATA:  Laparoscopic distal pancreatectomy and splenectomy 14 days ago. Surgical drain pulled yesterday. Left-sided pain. Nausea and vomiting. EXAM: CT ABDOMEN AND PELVIS WITH CONTRAST TECHNIQUE: Multidetector CT imaging of the abdomen and pelvis was performed using the standard protocol following bolus administration of intravenous contrast. CONTRAST:  75mL OMNIPAQUE IOHEXOL 350 MG/ML SOLN COMPARISON:  06/20/2018 CT.  MRI 02/27/2020. FINDINGS: Lower chest: Left base dependent atelectasis. Normal heart size without pericardial or pleural effusion. Hepatobiliary: Normal liver. Stone filled gallbladder without acute cholecystitis or biliary duct dilatation. Pancreas: Normal pancreatic head neck. Status post distal pancreatectomy. Complex fluid collection within the operative bed, measures 9.0 x 4.9 cm transverse on 22/2. 6.7 cm craniocaudal on coronal image 33. A contiguous portion extending into the  jejunal mesentery measures 3.7 x 4.7 by 4.6 cm including on 43/5. Spleen: Splenectomy. Small volume ill-defined fluid without significant mass effect in the splenectomy bed including on 13/2. Adrenals/Urinary Tract: Normal right adrenal gland. The left adrenal is likely compressed by the above described complex fluid collection. Normal kidneys, without hydronephrosis. Normal urinary bladder. Stomach/Bowel: Tiny hiatal hernia. Gastric body displaced and compressed by the lesser sac fluid collection. Normal colon and terminal ileum. Normal small bowel. Vascular/Lymphatic: Aortic atherosclerosis. Patent portal and hepatic veins. No abdominopelvic adenopathy. Reproductive: At least partial hysterectomy.  No adnexal mass. Other: Trace complex fluid/hemorrhage in the cul-de-sac on 71/2. Mild edema and ill-defined fluid adjacent the transverse colon on 27/2. No free intraperitoneal air. Musculoskeletal: No acute osseous abnormality. IMPRESSION: 1. Status post distal pancreatectomy and splenectomy. Complex fluid collection within the operative bed, favor hematoma. Complication of pancreatitis (early pseudocyst) could look similar. 2. Mass-effect upon the stomach from the above collection, without high-grade obstruction. 3. Cholelithiasis. 4. Trace complex fluid, likely hemorrhage in the cul-de-sac. 5.  Aortic Atherosclerosis (ICD10-I70.0). 6. These results will be called to the ordering clinician or representative by the Radiologist Assistant, and communication documented in the PACS or [employer]. Electronically Signed   By: Abigail Miyamoto M.D.   On: 10/06/2020 16:59     A/P: 50 year old female with a fluid collection adjacent to the remnant pancreas following laparoscopic distal pancreatectomy.  Given her known postoperative pancreatic fistula, I suspect this is an ongoing pancreatic leak that was not controlled by her surgical drain.  It is also possible that she had some bleeding after removal of the drain and  that this is a hematoma.  Regardless she will require drainage.  -Consult IR in a.m. for placement of a percutaneous drain -Regular diet, NPO after midnight -IV fluid hydration -Pain  and nausea control -VTE: SCDs  Nadeen Landau, MD [facility] Surgery Use [email or web address] to contact on call provider

## 2020-10-07 ENCOUNTER — Observation Stay (HOSPITAL_COMMUNITY): Payer: Medicaid Other

## 2020-10-07 ENCOUNTER — Encounter (HOSPITAL_COMMUNITY): Payer: Self-pay | Admitting: Surgery

## 2020-10-07 DIAGNOSIS — F32A Depression, unspecified: Secondary | ICD-10-CM | POA: Diagnosis present

## 2020-10-07 DIAGNOSIS — Z818 Family history of other mental and behavioral disorders: Secondary | ICD-10-CM | POA: Diagnosis not present

## 2020-10-07 DIAGNOSIS — Z8249 Family history of ischemic heart disease and other diseases of the circulatory system: Secondary | ICD-10-CM | POA: Diagnosis not present

## 2020-10-07 DIAGNOSIS — Z20822 Contact with and (suspected) exposure to covid-19: Secondary | ICD-10-CM | POA: Diagnosis present

## 2020-10-07 DIAGNOSIS — K8020 Calculus of gallbladder without cholecystitis without obstruction: Secondary | ICD-10-CM | POA: Diagnosis present

## 2020-10-07 DIAGNOSIS — Z9081 Acquired absence of spleen: Secondary | ICD-10-CM | POA: Diagnosis not present

## 2020-10-07 DIAGNOSIS — F419 Anxiety disorder, unspecified: Secondary | ICD-10-CM | POA: Diagnosis present

## 2020-10-07 DIAGNOSIS — C49A0 Gastrointestinal stromal tumor, unspecified site: Secondary | ICD-10-CM | POA: Diagnosis present

## 2020-10-07 DIAGNOSIS — K8689 Other specified diseases of pancreas: Secondary | ICD-10-CM | POA: Diagnosis present

## 2020-10-07 DIAGNOSIS — R110 Nausea: Secondary | ICD-10-CM | POA: Diagnosis present

## 2020-10-07 DIAGNOSIS — K859 Acute pancreatitis without necrosis or infection, unspecified: Secondary | ICD-10-CM | POA: Diagnosis present

## 2020-10-07 DIAGNOSIS — Z90411 Acquired partial absence of pancreas: Secondary | ICD-10-CM | POA: Diagnosis not present

## 2020-10-07 DIAGNOSIS — Z833 Family history of diabetes mellitus: Secondary | ICD-10-CM | POA: Diagnosis not present

## 2020-10-07 DIAGNOSIS — R188 Other ascites: Secondary | ICD-10-CM | POA: Diagnosis present

## 2020-10-07 LAB — SURGICAL PCR SCREEN
MRSA, PCR: NEGATIVE
Staphylococcus aureus: NEGATIVE

## 2020-10-07 MED ORDER — SODIUM CHLORIDE 0.9% FLUSH
5.0000 mL | Freq: Three times a day (TID) | INTRAVENOUS | Status: DC
  Administered 2020-10-07 – 2020-10-09 (×5): 5 mL

## 2020-10-07 MED ORDER — FENTANYL CITRATE (PF) 100 MCG/2ML IJ SOLN
INTRAMUSCULAR | Status: AC
  Filled 2020-10-07: qty 4

## 2020-10-07 MED ORDER — FENTANYL CITRATE (PF) 100 MCG/2ML IJ SOLN
INTRAMUSCULAR | Status: DC | PRN
  Administered 2020-10-07 (×2): 50 ug via INTRAVENOUS

## 2020-10-07 MED ORDER — ENSURE ENLIVE PO LIQD
237.0000 mL | Freq: Two times a day (BID) | ORAL | Status: DC
  Administered 2020-10-08 – 2020-10-09 (×3): 237 mL via ORAL

## 2020-10-07 MED ORDER — FLUMAZENIL 0.5 MG/5ML IV SOLN
INTRAVENOUS | Status: AC
  Filled 2020-10-07: qty 5

## 2020-10-07 MED ORDER — LIDOCAINE HCL (PF) 1 % IJ SOLN
INTRAMUSCULAR | Status: DC | PRN
  Administered 2020-10-07: 10 mL via INTRADERMAL

## 2020-10-07 MED ORDER — MIDAZOLAM HCL 2 MG/2ML IJ SOLN
INTRAMUSCULAR | Status: DC | PRN
  Administered 2020-10-07: 1 mg via INTRAVENOUS

## 2020-10-07 MED ORDER — LIDOCAINE 5 % EX PTCH
1.0000 | MEDICATED_PATCH | CUTANEOUS | Status: DC
  Administered 2020-10-07: 1 via TRANSDERMAL
  Filled 2020-10-07 (×2): qty 1

## 2020-10-07 MED ORDER — METHOCARBAMOL 500 MG PO TABS
750.0000 mg | ORAL_TABLET | Freq: Four times a day (QID) | ORAL | Status: DC
  Administered 2020-10-07 – 2020-10-09 (×6): 750 mg via ORAL
  Filled 2020-10-07 (×7): qty 2

## 2020-10-07 MED ORDER — MIDAZOLAM HCL 2 MG/2ML IJ SOLN
INTRAMUSCULAR | Status: AC
  Filled 2020-10-07: qty 4

## 2020-10-07 MED ORDER — NALOXONE HCL 0.4 MG/ML IJ SOLN
INTRAMUSCULAR | Status: AC
  Filled 2020-10-07: qty 1

## 2020-10-07 MED ORDER — ADULT MULTIVITAMIN W/MINERALS CH
1.0000 | ORAL_TABLET | Freq: Every day | ORAL | Status: DC
  Administered 2020-10-08 – 2020-10-09 (×2): 1 via ORAL
  Filled 2020-10-07 (×2): qty 1

## 2020-10-07 NOTE — Progress Notes (Signed)
The patient is injury-free, afebrile, alert, and oriented X 4. Vital signs were within the baseline during this shift. Pt's abdominal pain was controlled with current pain regiment. She denies chest pain, SOB, nausea, vomiting, dizziness, signs or symptoms of bleeding, or acute changes during this shift. We will continue to monitor and work toward achieving the care plan goals

## 2020-10-07 NOTE — Progress Notes (Signed)
Subjective: Pain somewhat improved this morning.  T-max 37.8.   Objective: Vital signs in last 24 hours: Temp:  [97.1 F (36.2 C)-100.1 F (37.8 C)] 100.1 F (37.8 C) (09/23 0643) Pulse Rate:  [90-102] 102 (09/23 0643) Resp:  [15-19] 15 (09/23 0643) BP: (110-129)/(74-86) 118/74 (09/23 0643) SpO2:  [97 %-100 %] 97 % (09/23 0643) Weight:  [79 kg] 79 kg (09/22 2000)    Intake/Output from previous day: 09/22 0701 - 09/23 0700 In: 733.4 [I.V.:733.4] Out: -  Intake/Output this shift: No intake/output data recorded.  PE: General: resting comfortably, NAD Neuro: alert and oriented, no focal deficits Resp: normal work of breathing on room air Abdomen: soft, nondistended, mildly tender to palpation in left upper quadrant.  Prior drain site is closed with no drainage.  Lap incisions are clean dry and intact with no erythema, induration, or drainage. Extremities: warm and well-perfused   Lab Results:  Recent Labs    10/06/20 1922  WBC 28.6*  HGB 10.3*  HCT 31.7*  PLT 888*   BMET Recent Labs    10/06/20 1922  NA 132*  K 4.2  CL 97*  CO2 26  GLUCOSE 120*  BUN 10  CREATININE 0.67  CALCIUM 11.0*   PT/INR Recent Labs    10/06/20 1922  LABPROT 15.7*  INR 1.3*   CMP     Component Value Date/Time   NA 132 (L) 10/06/2020 1922   K 4.2 10/06/2020 1922   CL 97 (L) 10/06/2020 1922   CO2 26 10/06/2020 1922   GLUCOSE 120 (H) 10/06/2020 1922   BUN 10 10/06/2020 1922   CREATININE 0.67 10/06/2020 1922   CALCIUM 11.0 (H) 10/06/2020 1922   PROT 8.2 (H) 10/06/2020 1922   ALBUMIN 3.9 10/06/2020 1922   AST 43 (H) 10/06/2020 1922   ALT 31 10/06/2020 1922   ALKPHOS 113 10/06/2020 1922   BILITOT 1.5 (H) 10/06/2020 1922   GFRNONAA >60 10/06/2020 1922   GFRAA >60 06/20/2018 0050   Lipase     Component Value Date/Time   LIPASE 49 06/20/2018 0050       Studies/Results: CT ABDOMEN PELVIS W CONTRAST  Result Date: 10/06/2020 CLINICAL DATA:  Laparoscopic  distal pancreatectomy and splenectomy 14 days ago. Surgical drain pulled yesterday. Left-sided pain. Nausea and vomiting. EXAM: CT ABDOMEN AND PELVIS WITH CONTRAST TECHNIQUE: Multidetector CT imaging of the abdomen and pelvis was performed using the standard protocol following bolus administration of intravenous contrast. CONTRAST:  65mL OMNIPAQUE IOHEXOL 350 MG/ML SOLN COMPARISON:  06/20/2018 CT.  MRI 02/27/2020. FINDINGS: Lower chest: Left base dependent atelectasis. Normal heart size without pericardial or pleural effusion. Hepatobiliary: Normal liver. Stone filled gallbladder without acute cholecystitis or biliary duct dilatation. Pancreas: Normal pancreatic head neck. Status post distal pancreatectomy. Complex fluid collection within the operative bed, measures 9.0 x 4.9 cm transverse on 22/2. 6.7 cm craniocaudal on coronal image 33. A contiguous portion extending into the jejunal mesentery measures 3.7 x 4.7 by 4.6 cm including on 43/5. Spleen: Splenectomy. Small volume ill-defined fluid without significant mass effect in the splenectomy bed including on 13/2. Adrenals/Urinary Tract: Normal right adrenal gland. The left adrenal is likely compressed by the above described complex fluid collection. Normal kidneys, without hydronephrosis. Normal urinary bladder. Stomach/Bowel: Tiny hiatal hernia. Gastric body displaced and compressed by the lesser sac fluid collection. Normal colon and terminal ileum. Normal small bowel. Vascular/Lymphatic: Aortic atherosclerosis. Patent portal and hepatic veins. No abdominopelvic adenopathy. Reproductive: At least partial hysterectomy.  No adnexal mass. Other: Trace complex fluid/hemorrhage in the cul-de-sac on 71/2. Mild edema and ill-defined fluid adjacent the transverse colon on 27/2. No free intraperitoneal air. Musculoskeletal: No acute osseous abnormality. IMPRESSION: 1. Status post distal pancreatectomy and splenectomy. Complex fluid collection within the operative bed,  favor hematoma. Complication of pancreatitis (early pseudocyst) could look similar. 2. Mass-effect upon the stomach from the above collection, without high-grade obstruction. 3. Cholelithiasis. 4. Trace complex fluid, likely hemorrhage in the cul-de-sac. 5.  Aortic Atherosclerosis (ICD10-I70.0). 6. These results will be called to the ordering clinician or representative by the Radiologist Assistant, and communication documented in the PACS or [employer]. Electronically Signed   By: Abigail Miyamoto M.D.   On: 10/06/2020 16:59    Anti-infectives: Anti-infectives (From admission, onward)    None        Assessment/Plan 50 year old female status post recent laparoscopic distal pancreatectomy and splenectomy, with left upper quadrant fluid collection following drain removal.  This is most likely pancreatic fluid.  - IR consulted for percutaneous drainage - Will send cultures and amylase level - NPO for procedure - Pain and nausea control VTE: SCDs Dispo: admitted to observation    LOS: 0 days    Michaelle Birks, MD [facility] Surgery General, Hepatobiliary and Pancreatic Surgery 10/07/20 8:04 AM

## 2020-10-07 NOTE — Consult Note (Signed)
Chief Complaint: Patient was seen in consultation today for image guided drainage of left upper abdominal fluid collection  Referring Physician(s): Allen,Shelby L  Supervising Physician: Markus Daft  Patient Status: [facility] - In-pt  History of Present Illness: [name] is a 50 y.o. 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  She was discharged home with a surgical drain in place for postop pancreatic fistula.  She did have a bleeding episode on postop day 2 which required 2 units of packed red blood cells although she remained hemodynamically stable.  She came to the surgical clinic on 9/21 for drain check as her output was very low for several days.  Her drain was subsequently removed and afterwards she began having crampy left-sided and lower abdominal pain.  Vitals were stable.  Due to ongoing crampy abdominal pain as well as nausea and poor oral intake she underwent outpatient CT scan which revealed:  1. Status post distal pancreatectomy and splenectomy. Complex fluid collection within the operative bed, favor hematoma. Complication of pancreatitis (early pseudocyst) could look similar. 2. Mass-effect upon the stomach from the above collection, without high-grade obstruction. 3. Cholelithiasis. 4. Trace complex fluid, likely hemorrhage in the cul-de-sac. 5.  Aortic Atherosclerosis (ICD10-I70.0).  She was subsequently directly admitted to [facility]  Latest temperature 100.1.  Slightly tachycardic at 102, BP 118/74.  WBC yesterday 28.6, hemoglobin 10.3, platelets 888k, PT 15.7, INR 1.3, creatinine 0.67, total bilirubin 1.5.  Request now received from surgical team for image guided left upper abdominal fluid collection  drainage.  Past Medical History:  Diagnosis Date   Anxiety    Depression    history - no current prob - no meds    Past Surgical History:  Procedure Laterality Date   CESAREAN SECTION     x 4   CYSTOSCOPY  12/13/2010   Procedure: CYSTOSCOPY;  Surgeon: Eldred Manges, MD;  Location: [facility];  Service: Gynecology;;   ESOPHAGOGASTRODUODENOSCOPY (EGD) WITH PROPOFOL N/A 04/26/2020   Procedure: ESOPHAGOGASTRODUODENOSCOPY (EGD) WITH PROPOFOL;  Surgeon: Arta Silence, MD;  Location: [facility];  Service: Endoscopy;  Laterality: N/A;   EYE MUSCLE SURGERY     as child - right eye   HYSTERECTOMY ABDOMINAL WITH SALPINGECTOMY  11/2010   LAPAROSCOPIC SPLENECTOMY N/A 09/22/2020   Procedure: LAPAROSCOPIC SPLENECTOMY;  Surgeon: Dwan Bolt, MD;  Location: [facility];  Service: General;  Laterality: N/A;   PANCREATECTOMY N/A 09/22/2020   Procedure: LAPAROSCOPIC DISTAL PANCREATECTOMY;  Surgeon: Dwan Bolt, MD;  Location: [place];  Service: General;  Laterality: N/A;  GEN AND TAP BLOCK   TUBAL LIGATION     UPPER ESOPHAGEAL ENDOSCOPIC ULTRASOUND (EUS) N/A 04/26/2020   Procedure: UPPER ESOPHAGEAL ENDOSCOPIC ULTRASOUND (EUS) Linear;  Surgeon: Arta Silence, MD;  Location: [facility];  Service: Endoscopy;  Laterality: N/A;    Allergies: Patient has no known allergies.  Medications: Prior to Admission medications   Medication Sig Start Date End Date Taking? Authorizing Provider  acetaminophen (TYLENOL) 500 MG tablet Take 2 tablets (1,000 mg total) by mouth every 8 (eight) hours as needed for mild pain. 09/27/20  Yes Dwan Bolt, MD  docusate sodium (COLACE) 100 MG capsule Take 100 mg by mouth daily as needed for moderate constipation.   Yes [provider]  methocarbamol (ROBAXIN) 500 MG tablet Take 1 tablet (500 mg total) by mouth every 6 (six) hours as needed for muscle spasms. Patient taking differently: Take 500 mg by mouth every 8 (eight) hours as needed for muscle spasms. 09/27/20   Yes Dwan Bolt, MD  ondansetron (ZOFRAN-ODT) 4 MG disintegrating tablet Take 1 tablet (4 mg total) by mouth every 6 (six) hours as needed for nausea. 09/27/20  Yes Dwan Bolt, MD  oxyCODONE (OXY IR/ROXICODONE) 5 MG immediate release tablet Take 1 tablet (5 mg total) by mouth every 4 (four) hours as needed for severe pain. 09/27/20  Yes Dwan Bolt, MD  valACYclovir (VALTREX) 500 MG tablet Take 500 mg by mouth 2 (two) times daily as needed (outbreaks).   Yes [provider]  Vitamin D, Ergocalciferol, (DRISDOL) 1.25 MG (50000 UNIT) CAPS capsule Take 50,000 Units by mouth every 7 (seven) days.   Yes [provider]     Family History  Problem Relation Age of Onset   Diabetes Mother    Diabetes Father    Diabetes Brother    Cancer Brother    Hypertension Brother    Diabetes Brother    Bipolar disorder Brother     Social History   Socioeconomic History   Marital status: Single    Spouse name: Not on file   Number of children: Not on file   Years of education: Not on file   Highest education level: Not on file  Occupational History   Not on file  Tobacco Use   Smoking status: Never   Smokeless tobacco: Never  Vaping Use   Vaping Use: Never used  Substance and Sexual Activity   Alcohol use: Yes    Comment: socially   Drug use: No   Sexual activity: Yes    Birth control/protection: Surgical  Other Topics Concern   Not on file  Social History Narrative   Not on file   Social Determinants of Health   Financial Resource Strain: Not on file  Food Insecurity: Not on file  Transportation Needs: Not on file  Physical Activity: Not on file  Stress: Not on file  Social Connections: Not on file      Review of Systems currently denies headache, chest pain, dyspnea, cough, vomiting or abnormal bleeding.  She continues to have some left-sided abdominal discomfort, back pain along with some recent nausea.  Vital Signs: BP 118/74   Pulse (!) 102    Temp 100.1 F (37.8 C) (Oral)   Resp 15   Ht 5' 6.02" (1.677 m)   Wt 174 lb 2.6 oz (79 kg)   LMP 11/24/2010   SpO2 97%   BMI 28.09 kg/m   Physical Exam awake, alert.  Chest clear to auscultation bilaterally.  Heart with slightly tachycardic but regular rhythm.  Abdomen soft, positive bowel sounds, tender left-sided abdominal discomfort to palpation.  No lower extremity edema  Imaging: CT ABDOMEN PELVIS W CONTRAST  Result Date: 10/06/2020 CLINICAL DATA:  Laparoscopic distal pancreatectomy and splenectomy 14 days ago. Surgical drain pulled yesterday. Left-sided pain. Nausea and vomiting. EXAM: CT ABDOMEN AND PELVIS WITH CONTRAST TECHNIQUE: Multidetector CT imaging of the abdomen and pelvis was performed using the standard protocol following bolus administration of intravenous contrast. CONTRAST:  33mL OMNIPAQUE IOHEXOL 350 MG/ML SOLN COMPARISON:  06/20/2018 CT.  MRI 02/27/2020. FINDINGS: Lower chest: Left base dependent atelectasis. Normal heart size without pericardial or pleural effusion. Hepatobiliary: Normal liver. Stone filled gallbladder without acute cholecystitis or biliary  duct dilatation. Pancreas: Normal pancreatic head neck. Status post distal pancreatectomy. Complex fluid collection within the operative bed, measures 9.0 x 4.9 cm transverse on 22/2. 6.7 cm craniocaudal on coronal image 33. A contiguous portion extending into the jejunal mesentery measures 3.7 x 4.7 by 4.6 cm including on 43/5. Spleen: Splenectomy. Small volume ill-defined fluid without significant mass effect in the splenectomy bed including on 13/2. Adrenals/Urinary Tract: Normal right adrenal gland. The left adrenal is likely compressed by the above described complex fluid collection. Normal kidneys, without hydronephrosis. Normal urinary bladder. Stomach/Bowel: Tiny hiatal hernia. Gastric body displaced and compressed by the lesser sac fluid collection. Normal colon and terminal ileum. Normal small bowel.  Vascular/Lymphatic: Aortic atherosclerosis. Patent portal and hepatic veins. No abdominopelvic adenopathy. Reproductive: At least partial hysterectomy.  No adnexal mass. Other: Trace complex fluid/hemorrhage in the cul-de-sac on 71/2. Mild edema and ill-defined fluid adjacent the transverse colon on 27/2. No free intraperitoneal air. Musculoskeletal: No acute osseous abnormality. IMPRESSION: 1. Status post distal pancreatectomy and splenectomy. Complex fluid collection within the operative bed, favor hematoma. Complication of pancreatitis (early pseudocyst) could look similar. 2. Mass-effect upon the stomach from the above collection, without high-grade obstruction. 3. Cholelithiasis. 4. Trace complex fluid, likely hemorrhage in the cul-de-sac. 5.  Aortic Atherosclerosis (ICD10-I70.0). 6. These results will be called to the ordering clinician or representative by the Radiologist Assistant, and communication documented in the PACS or [employer]. Electronically Signed   By: Abigail Miyamoto M.D.   On: 10/06/2020 16:59    Labs:  CBC: Recent Labs    09/24/20 0038 09/24/20 1223 09/25/20 0120 09/26/20 0321 10/06/20 1922  WBC 12.6*  --  14.8* 14.9* 28.6*  HGB 6.5* 8.6* 8.1* 8.6* 10.3*  HCT 19.6* 25.1* 23.9* 26.5* 31.7*  PLT 158  --  165 238 888*    COAGS: Recent Labs    10/06/20 1922  INR 1.3*    BMP: Recent Labs    09/23/20 0247 09/24/20 0038 09/25/20 0120 10/06/20 1922  NA 137 137 138 132*  K 4.8 3.4* 3.8 4.2  CL 105 104 111 97*  CO2 26 27 22 26   GLUCOSE 139* 120* 107* 120*  BUN 13 10 7 10   CALCIUM 9.5 9.0 9.0 11.0*  CREATININE 0.81 0.69 0.60 0.67  GFRNONAA >60 >60 >60 >60    LIVER FUNCTION TESTS: Recent Labs    10/06/20 1922  BILITOT 1.5*  AST 43*  ALT 31  ALKPHOS 113  PROT 8.2*  ALBUMIN 3.9    TUMOR MARKERS: No results for input(s): AFPTM, CEA, CA199, CHROMGRNA in the last 8760 hours.  Assessment and Plan: 50 y.o. 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  She was discharged home with a surgical drain in place for postop pancreatic fistula.  She did have a bleeding episode on postop day 2 which required 2 units of packed red blood cells although she remained hemodynamically stable.  She came to the surgical clinic on 9/21 for drain check as her output was very low for several days.  Her drain was subsequently removed and afterwards she began having crampy left-sided and lower abdominal pain.  Vitals were stable.  Due to ongoing crampy abdominal pain as well as nausea and poor oral intake she underwent outpatient CT scan which revealed:  1. Status  post distal pancreatectomy and splenectomy. Complex fluid collection within the operative bed, favor hematoma. Complication of pancreatitis (early pseudocyst) could look similar. 2. Mass-effect upon the stomach from the above collection, without high-grade obstruction. 3. Cholelithiasis. 4. Trace complex fluid, likely hemorrhage in the cul-de-sac. 5.  Aortic Atherosclerosis (ICD10-I70.0).  She was subsequently directly admitted to [facility].  Latest temperature 100.1.  Slightly tachycardic at 102, BP 118/74.  WBC yesterday 28.6, hemoglobin 10.3, platelets 888k, PT 15.7, INR 1.3, creatinine 0.67, total bilirubin 1.5.  Request now received from surgical team for image guided left upper abdominal fluid collection drainage.  Imaging studies were reviewed by Dr. Anselm Pancoast.Risks and benefits discussed with the patient including bleeding, infection, damage to adjacent structures, bowel perforation/fistula connection, and sepsis.  All of the patient's questions were answered, patient is agreeable to proceed. Consent signed and in chart. Procedure scheduled for  today.   Thank you for this interesting consult.  I greatly enjoyed meeting SITA MANGEN and look forward to participating in their care.  A copy of this report was sent to the requesting provider on this date.  Electronically Signed: D. Rowe Robert, PA-C 10/07/2020, 9:03 AM   I spent a total of  25 minutes   in face to face in clinical consultation, greater than 50% of which was counseling/coordinating care for image guided drainage of left upper abdominal fluid collection

## 2020-10-07 NOTE — Progress Notes (Signed)
Initial Nutrition Assessment  INTERVENTION:   Once diet advanced: -Ensure Plus PO BID, each provides 350 kcals and 13g protein -Multivitamin with minerals daily   NUTRITION DIAGNOSIS:   Increased nutrient needs related to acute illness as evidenced by estimated needs.  GOAL:   Patient will meet greater than or equal to 90% of their needs  MONITOR:   PO intake, Supplement acceptance, Diet advancement, Labs, Weight trends, I & O's  REASON FOR ASSESSMENT:   Malnutrition Screening Tool    ASSESSMENT:   50 y.o. 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  She was discharged home with a surgical drain in place for postop pancreatic fistula.  Patient currently having IR place drain, currently NPO. Per chart review, pt has been having nausea and poor PO since discharge 9/13. Once diet advanced, recommend Ensure supplements.  Pt reports losing 15 lbs over the past 2 weeks. Per weight records, no weight loss noted but weight from 9/22 seems stated not measured.  Medications: Lactated ringers  Labs reviewed: CBGs: 101 Low Na   NUTRITION - FOCUSED PHYSICAL EXAM:  Unable to complete  Diet Order:   Diet Order             Diet NPO time specified Except for: Sips with Meds  Diet effective midnight                   EDUCATION NEEDS:   No education needs have been identified at this time  Skin:  Skin Assessment: Skin Integrity Issues: Skin Integrity Issues:: Incisions Incisions: 9/8 abdomen  Last BM:  PTA  Height:   Ht Readings from Last 1 Encounters:  10/06/20 5' 6.02" (1.677 m)    Weight:   Wt Readings from Last 1 Encounters:  10/06/20 79 kg   BMI:  Body mass index is 28.09 kg/m.  Estimated Nutritional Needs:    Kcal:  1600-1800  Protein:  85-100g  Fluid:  1.8L/day   Clayton Bibles, MS, RD, LDN Inpatient Clinical Dietitian Contact information available via Amion

## 2020-10-07 NOTE — Progress Notes (Signed)
[facility] was paged to assess pt for admission. The doctor on coverage called he reports that he will see the patient tonight. Will continue to follow up

## 2020-10-08 LAB — CBC
HCT: 29.1 % — ABNORMAL LOW (ref 36.0–46.0)
Hemoglobin: 9.4 g/dL — ABNORMAL LOW (ref 12.0–15.0)
MCH: 30.7 pg (ref 26.0–34.0)
MCHC: 32.3 g/dL (ref 30.0–36.0)
MCV: 95.1 fL (ref 80.0–100.0)
Platelets: 811 10*3/uL — ABNORMAL HIGH (ref 150–400)
RBC: 3.06 MIL/uL — ABNORMAL LOW (ref 3.87–5.11)
RDW: 15 % (ref 11.5–15.5)
WBC: 13 10*3/uL — ABNORMAL HIGH (ref 4.0–10.5)
nRBC: 0.5 % — ABNORMAL HIGH (ref 0.0–0.2)

## 2020-10-08 LAB — BASIC METABOLIC PANEL
Anion gap: 8 (ref 5–15)
BUN: 8 mg/dL (ref 6–20)
CO2: 28 mmol/L (ref 22–32)
Calcium: 11 mg/dL — ABNORMAL HIGH (ref 8.9–10.3)
Chloride: 101 mmol/L (ref 98–111)
Creatinine, Ser: 0.56 mg/dL (ref 0.44–1.00)
GFR, Estimated: >60 mL/min (ref >60)
Glucose, Bld: 107 mg/dL — ABNORMAL HIGH (ref 70–99)
Potassium: 3.6 mmol/L (ref 3.5–5.1)
Sodium: 137 mmol/L (ref 135–145)

## 2020-10-08 NOTE — Progress Notes (Signed)
Subjective: WBC significantly decreased.  Patient has been afebrile for the last 24 hours.  Underwent percutaneous drain placement yesterday, reporting some pain around her drain site.  Had minimal p.o. intake last night.   Objective: Vital signs in last 24 hours: Temp:  [98.3 F (36.8 C)-99.2 F (37.3 C)] 98.3 F (36.8 C) (09/24 0454) Pulse Rate:  [92-106] 98 (09/24 0454) Resp:  [14-19] 18 (09/24 0454) BP: (104-136)/(72-96) 104/75 (09/24 0454) SpO2:  [98 %-100 %] 98 % (09/24 0454) Last BM Date: 10/06/20  Intake/Output from previous day: 09/23 0701 - 09/24 0700 In: 360 [P.O.:355] Out: 120 [Drains:120] Intake/Output this shift: No intake/output data recorded.  PE: General: resting comfortably, NAD Neuro: alert and oriented, no focal deficits Resp: normal work of breathing on room air Abdomen: soft, nondistended, mildly tender to palpation in left upper quadrant.  Drain with dark red fluid consistent with old blood.  Lap incisions are clean dry and intact with no erythema, induration, or drainage. Extremities: warm and well-perfused   Lab Results:  Recent Labs    10/06/20 1922 10/08/20 0500  WBC 28.6* 13.0*  HGB 10.3* 9.4*  HCT 31.7* 29.1*  PLT 888* 811*   BMET Recent Labs    10/06/20 1922 10/08/20 0500  NA 132* 137  K 4.2 3.6  CL 97* 101  CO2 26 28  GLUCOSE 120* 107*  BUN 10 8  CREATININE 0.67 0.56  CALCIUM 11.0* 11.0*   PT/INR Recent Labs    10/06/20 1922  LABPROT 15.7*  INR 1.3*   CMP     Component Value Date/Time   NA 137 10/08/2020 0500   K 3.6 10/08/2020 0500   CL 101 10/08/2020 0500   CO2 28 10/08/2020 0500   GLUCOSE 107 (H) 10/08/2020 0500   BUN 8 10/08/2020 0500   CREATININE 0.56 10/08/2020 0500   CALCIUM 11.0 (H) 10/08/2020 0500   PROT 8.2 (H) 10/06/2020 1922   ALBUMIN 3.9 10/06/2020 1922   AST 43 (H) 10/06/2020 1922   ALT 31 10/06/2020 1922   ALKPHOS 113 10/06/2020 1922   BILITOT 1.5 (H) 10/06/2020 1922   GFRNONAA >60  10/08/2020 0500   GFRAA >60 06/20/2018 0050   Lipase     Component Value Date/Time   LIPASE 49 06/20/2018 0050       Studies/Results: CT ABDOMEN PELVIS W CONTRAST  Result Date: 10/06/2020 CLINICAL DATA:  Laparoscopic distal pancreatectomy and splenectomy 14 days ago. Surgical drain pulled yesterday. Left-sided pain. Nausea and vomiting. EXAM: CT ABDOMEN AND PELVIS WITH CONTRAST TECHNIQUE: Multidetector CT imaging of the abdomen and pelvis was performed using the standard protocol following bolus administration of intravenous contrast. CONTRAST:  7mL OMNIPAQUE IOHEXOL 350 MG/ML SOLN COMPARISON:  06/20/2018 CT.  MRI 02/27/2020. FINDINGS: Lower chest: Left base dependent atelectasis. Normal heart size without pericardial or pleural effusion. Hepatobiliary: Normal liver. Stone filled gallbladder without acute cholecystitis or biliary duct dilatation. Pancreas: Normal pancreatic head neck. Status post distal pancreatectomy. Complex fluid collection within the operative bed, measures 9.0 x 4.9 cm transverse on 22/2. 6.7 cm craniocaudal on coronal image 33. A contiguous portion extending into the jejunal mesentery measures 3.7 x 4.7 by 4.6 cm including on 43/5. Spleen: Splenectomy. Small volume ill-defined fluid without significant mass effect in the splenectomy bed including on 13/2. Adrenals/Urinary Tract: Normal right adrenal gland. The left adrenal is likely compressed by the above described complex fluid collection. Normal kidneys, without hydronephrosis. Normal urinary bladder. Stomach/Bowel: Tiny hiatal hernia. Gastric body  displaced and compressed by the lesser sac fluid collection. Normal colon and terminal ileum. Normal small bowel. Vascular/Lymphatic: Aortic atherosclerosis. Patent portal and hepatic veins. No abdominopelvic adenopathy. Reproductive: At least partial hysterectomy.  No adnexal mass. Other: Trace complex fluid/hemorrhage in the cul-de-sac on 71/2. Mild edema and ill-defined fluid  adjacent the transverse colon on 27/2. No free intraperitoneal air. Musculoskeletal: No acute osseous abnormality. IMPRESSION: 1. Status post distal pancreatectomy and splenectomy. Complex fluid collection within the operative bed, favor hematoma. Complication of pancreatitis (early pseudocyst) could look similar. 2. Mass-effect upon the stomach from the above collection, without high-grade obstruction. 3. Cholelithiasis. 4. Trace complex fluid, likely hemorrhage in the cul-de-sac. 5.  Aortic Atherosclerosis (ICD10-I70.0). 6. These results will be called to the ordering clinician or representative by the Radiologist Assistant, and communication documented in the PACS or [employer]. Electronically Signed   By: Abigail Miyamoto M.D.   On: 10/06/2020 16:59   CT IMAGE GUIDED FLUID DRAIN BY CATHETER  Result Date: 10/07/2020 INDICATION: 50 year old with recent distal pancreatectomy and splenectomy. CT imaging demonstrates a large postoperative fluid collection in the upper abdomen. Surgery request percutaneous drainage of this collection. EXAM: CT-guided drainage of upper abdominal fluid collection MEDICATIONS: Moderate sedation ANESTHESIA/SEDATION: Fentanyl 100 mcg IV; Versed 4.0 mg IV Moderate Sedation Time:  36 minutes The patient was continuously monitored during the procedure by the interventional radiology nurse under my direct supervision. COMPLICATIONS: None immediate. PROCEDURE: Informed written consent was obtained from the patient after a thorough discussion of the procedural risks, benefits and alternatives. All questions were addressed. A timeout was performed prior to the initiation of the procedure. Patient was placed supine on CT scanner. Images through the abdomen were obtained. The large collection in the anterior upper abdomen was identified and targeted. The anterior abdomen was prepped and draped in sterile fashion. Maximal barrier sterile technique was utilized including caps, mask, sterile  gowns, sterile gloves, sterile drape, hand hygiene and skin antiseptic. Skin was anesthetized using 1% lidocaine. A small incision was made. Using CT guidance, an 18 gauge trocar needle was directed into this collection. Dark bloody fluid was aspirated. A superstiff Amplatz wire was advanced into the collection and the tract was dilated to accommodate a 12 [place] multipurpose drain. Approximately 50 mL of dark bloody fluid was aspirated. Follow up CT images were obtained. Catheter was sutured to skin. Dressing was placed. FINDINGS: Again noted is a large fluid collection in the anterior upper abdomen. Hounsfield units are slightly dense measuring over 30. Dark bloody fluid was removed from the collection. 50 mL of bloody fluid was removed. Drain was coiled in the anterior portion of the collection at the end of the procedure. Residual fluid remaining within the collection at the end of the procedure. IMPRESSION: CT-guided drainage of the anterior abdominal fluid collection. Findings are suggestive for a postoperative hematoma. Dark bloody fluid was removed. Fluid was sent for amylase and culture. Electronically Signed   By: Markus Daft M.D.   On: 10/07/2020 15:17    Anti-infectives: Anti-infectives (From admission, onward)    None        Assessment/Plan 50 year old female status post recent laparoscopic distal pancreatectomy and splenectomy, with left upper quadrant fluid collection following drain removal.  Status post percutaneous drain placement, fluid is consistent with a hematoma. -Fluid cultures and drain amylase are pending -Regular diet -Multimodal pain control -VTE: SCDs -Dispo: Inpatient, remain in hospital today to optimize pain control.  Anticipate discharge home tomorrow morning.    LOS:  1 day    Michaelle Birks, [facility], Hepatobiliary and Pancreatic Surgery 10/08/20 8:24 AM

## 2020-10-08 NOTE — Progress Notes (Signed)
The patient is injury-free, afebrile, alert, and oriented X 4. Vital signs were within the baseline during this shift. Pt's abdominal pain was controlled with current pain regiment. JP drain dressing was changes and site looks clean, intact, with no s/s of infection. She denies chest pain, SOB, nausea, vomiting, dizziness, signs or symptoms of bleeding, or acute changes during this shift. We will continue to monitor and work toward achieving the care plan goals

## 2020-10-08 NOTE — Plan of Care (Signed)
  Problem: Education: Goal: Knowledge of General Education information will improve Description: Including pain rating scale, medication(s)/side effects and non-pharmacologic comfort measures Outcome: Progressing   Problem: Health Behavior/Discharge Planning: Goal: Ability to manage health-related needs will improve Outcome: Progressing   Problem: Clinical Measurements: Goal: Ability to maintain clinical measurements within normal limits will improve Outcome: Progressing Goal: Will remain free from infection Outcome: Progressing Goal: Diagnostic test results will improve Outcome: Progressing Goal: Respiratory complications will improve Outcome: Progressing Goal: Cardiovascular complication will be avoided Outcome: Progressing   Problem: Nutrition: Goal: Adequate nutrition will be maintained Outcome: Progressing   Problem: Coping: Goal: Level of anxiety will decrease Outcome: Progressing   Problem: Activity: Goal: Risk for activity intolerance will decrease Outcome: Progressing   Problem: Elimination: Goal: Will not experience complications related to bowel motility Outcome: Progressing Goal: Will not experience complications related to urinary retention Outcome: Progressing   Problem: Safety: Goal: Ability to remain free from injury will improve Outcome: Progressing   Problem: Skin Integrity: Goal: Risk for impaired skin integrity will decrease Outcome: Progressing   Problem: Pain Management: Goal: Satisfaction with pain management regimen will be met by discharge Outcome: Progressing

## 2020-10-09 MED ORDER — LIDOCAINE 5 % EX PTCH: 1.0000 | MEDICATED_PATCH | CUTANEOUS | 1 refills | Status: AC

## 2020-10-09 MED ORDER — OXYCODONE HCL 5 MG PO TABS: 5.0000 mg | ORAL_TABLET | Freq: Four times a day (QID) | ORAL | 0 refills | Status: AC | PRN

## 2020-10-09 MED ORDER — SODIUM CHLORIDE 0.9% FLUSH: 10.0000 mL | Freq: Two times a day (BID) | INTRAVENOUS | 1 refills | Status: AC

## 2020-10-09 NOTE — Discharge Summary (Signed)
Physician Discharge Summary   Patient ID: [name] [id number] 50 y.o. [birth year]  Admit date: 10/06/2020  Discharge date and time: 10/09/2020  Admitting Physician: Dwan Bolt, MD   Discharge Physician: Michaelle Birks, MD  Admission Diagnoses: Intraabdominal fluid collection [R18.8]  Discharge Diagnoses: Intraabdominal fluid collection  Admission Condition: stable  Discharged Condition: good  Indication for Admission: 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  She had a postoperative pancreatic fistula which was controlled with her surgical JP drain.  She came to the clinic last week for a drain check, at which time her output was very low at 5 to 10 mL/day for several days.  The drain was removed and afterward she developed crampy left-sided abdominal pain and nausea.  CT scan was done and showed a large fluid collection adjacent to the tail of the pancreas with mass-effect on the stomach.    Hospital Course: The patient was directly admitted to [facility] hospital on the evening of 9/22.  The following morning IR was consulted and the patient underwent placement of a percutaneous drain into the fluid collection.  The drainage was consistent with a hematoma.  Cultures and an amylase level were sent on the drain fluid.  Following the procedure the patient was started on a regular diet, which she tolerated.  She remained in the hospital for 2 nights following the procedure to optimize pain and nausea control.  On the morning of 9/25, her pain and nausea were improved and she was tolerating regular diet.  She underwent teaching to perform drain flushes at home.  She was examined and deemed appropriate for discharge home with clinic follow-up later this week.  Drain cultures had no growth to date and drain amylase was pending at time of discharge.  Consults:   Interventional radiology  Significant Diagnostic Studies: radiology: CT abd/pelvis: IMPRESSION: 1. Status post distal pancreatectomy and splenectomy. Complex fluid collection within the operative bed, favor hematoma. Complication of pancreatitis (early pseudocyst) could look similar. 2. Mass-effect upon the stomach from the above collection, without high-grade obstruction. 3. Cholelithiasis. 4. Trace complex fluid, likely hemorrhage in the cul-de-sac.  Treatments: IV hydration, analgesia: acetaminophen, Dilaudid, oxycodone and robaxin, and procedures: percutaneous drainage catheter placement  Discharge Exam: General: resting comfortably, NAD Neuro: alert and oriented, no focal deficits Resp: normal work of breathing on room air Abdomen: soft, nondistended, nontender to palpation. Incisions clean dry and intact with no erythema, induration, or drainage.  Left upper quadrant percutaneous drain with dark red fluid consistent with old hematoma. Extremities: warm and well-perfused   Disposition: Discharge disposition: 01-Home or Self Care       Patient Instructions:  Allergies as of 10/09/2020   No Known Allergies      Medication List     TAKE these medications    acetaminophen 500 MG tablet Commonly known as: TYLENOL Take 2 tablets (1,000 mg total) by mouth every 8 (eight) hours as needed for mild pain.   docusate sodium 100 MG capsule Commonly known as: COLACE Take 100 mg by mouth daily as needed for moderate constipation.   lidocaine 5 % Commonly known as: LIDODERM Place 1 patch onto the skin daily. Remove & Discard patch within 12 hours or as directed by MD. Apply as needed for pain around drain site.   methocarbamol 500 MG tablet Commonly known as: ROBAXIN Take 1 tablet (500 mg  total) by mouth every 6 (six) hours as needed for muscle spasms. What changed: when to take this   ondansetron 4 MG disintegrating tablet Commonly known as: ZOFRAN-ODT Take 1 tablet (4  mg total) by mouth every 6 (six) hours as needed for nausea.   oxyCODONE 5 MG immediate release tablet Commonly known as: Oxy IR/ROXICODONE Take 1 tablet (5 mg total) by mouth every 6 (six) hours as needed for severe pain. What changed: when to take this   sodium chloride flush 0.9 % Soln Commonly known as: NS 10 mLs by Intracatheter route every 12 (twelve) hours.   valACYclovir 500 MG tablet Commonly known as: VALTREX Take 500 mg by mouth 2 (two) times daily as needed (outbreaks).   Vitamin D (Ergocalciferol) 1.25 MG (50000 UNIT) Caps capsule Commonly known as: DRISDOL Take 50,000 Units by mouth every 7 (seven) days.       Activity: no heavy lifting for 3 weeks Diet: regular diet Wound Care: keep wound clean and dry Flush drain 2 times daily with 51mL sterile saline.  Follow-up with Dr. Zenia Resides in 3 days.  Signed: Dwan Bolt 10/09/2020 8:14 AM

## 2020-10-09 NOTE — Discharge Instructions (Addendum)
[facility] [place] [facility] DISCHARGE INSTRUCTIONS  Activity No heavy lifting greater than 10 pounds for 4 weeks after surgery. Ok to shower, but do not bathe or submerge incisions underwater. Do not drive while taking narcotic pain medication.  Wound Care Your incision is covered with skin glue called Dermabond. This will peel off on its own over time. You may shower and allow warm soapy water to run over your incisions. Gently pat dry. Do not submerge your incision underwater. Monitor your incision for any new redness, tenderness, or drainage.  When to Call us: Fever greater than 100.5 New redness, drainage, or swelling at incision site Severe pain, nausea, or vomiting Jaundice (yellowing of the whites of the eyes or skin)  Follow-up You have an appointment scheduled with Dr. Zenia Resides on 10/12/20 at 9:30am. This will be at the [facility] office at [address], [place], [place], [place]. Please arrive at least 15 minutes prior to your scheduled appointment time.  For questions or concerns, please call the office at [phone].   DRAIN CARE INSTRUCTIONS Wash your hands prior to caring for your drain. Uncap the bulb to release the suction. Pour out the bulb contents into a measuring cup and record the amount. Squeeze the bulb and replace the cap to apply suction again. You should empty your drain each time you notice the bulb is full. Empty at least once a day and more often when the bulb fills up. Flush your drain catheter twice daily with 10 mL sterile saline.  Please keep a daily log of your drain output and bring this with you when you come to clinic.

## 2020-10-09 NOTE — Progress Notes (Signed)
Nursing Discharge Note   Admit Date: 10/06/2020  Discharge date: 10/09/2020   [name] is to be discharged home per MD order.  AVS completed. Reviewed with patient and family at bedside. Highlighted copy provided for patient to take home.  Patient/caregiver able to verbalize understanding of discharge instructions. PIV removed. Patient stable upon discharge.   Patient sent home with 5 days worth of 10 ml normal saline flushes, alcohol swabs, and drain sponges. Patient demonstrated how to disconnect tubing, clean site, flush drain, and reattach tubing. Husband, Legrand Como and patient also educated on drain care, tube maintenance, and infection prevention. Both verbalized understanding of discharge information.    Discharge Instructions      [facility] [place] [facility] DISCHARGE INSTRUCTIONS  Activity No heavy lifting greater than 10 pounds for 4 weeks after surgery. Ok to shower, but do not bathe or submerge incisions underwater. Do not drive while taking narcotic pain medication.  Wound Care Your incision is covered with skin glue called Dermabond. This will peel off on its own over time. You may shower and allow warm soapy water to run over your incisions. Gently pat dry. Do not submerge your incision underwater. Monitor your incision for any new redness, tenderness, or drainage.  When to Call us: Fever greater than 100.5 New redness, drainage, or swelling at incision site Severe pain, nausea, or vomiting Jaundice (yellowing of the whites of the eyes or skin)  Follow-up You have an appointment scheduled with Dr. Zenia Resides on 10/12/20 at 9:30am. This will be at the [facility] office at [address], [place], [place], [place]. Please arrive at least 15 minutes prior to your scheduled appointment time.  For questions or concerns, please call the office at [phone].   DRAIN CARE INSTRUCTIONS Wash your hands prior to caring for your drain. Uncap the bulb to  release the suction. Pour out the bulb contents into a measuring cup and record the amount. Squeeze the bulb and replace the cap to apply suction again. You should empty your drain each time you notice the bulb is full. Empty at least once a day and more often when the bulb fills up. Flush your drain catheter twice daily with 10 mL sterile saline.  Please keep a daily log of your drain output and bring this with you when you come to clinic.      Allergies as of 10/09/2020   No Known Allergies      Medication List     TAKE these medications    acetaminophen 500 MG tablet Commonly known as: TYLENOL Take 2 tablets (1,000 mg total) by mouth every 8 (eight) hours as needed for mild pain.   docusate sodium 100 MG capsule Commonly known as: COLACE Take 100 mg by mouth daily as needed for moderate constipation.   lidocaine 5 % Commonly known as: LIDODERM Place 1 patch onto the skin daily. Remove & Discard patch within 12 hours or as directed by MD. Apply as needed for pain around drain site.   methocarbamol 500 MG tablet Commonly known as: ROBAXIN Take 1 tablet (500 mg total) by mouth every 6 (six) hours as needed for muscle spasms. What changed: when to take this   ondansetron 4 MG disintegrating tablet Commonly known as: ZOFRAN-ODT Take 1 tablet (4 mg total) by mouth every 6 (six) hours as needed for nausea.   oxyCODONE 5 MG immediate release tablet Commonly known as: Oxy IR/ROXICODONE Take 1 tablet (5 mg total) by mouth every 6 (  six) hours as needed for severe pain. What changed: when to take this   sodium chloride flush 0.9 % Soln Commonly known as: NS 10 mLs by Intracatheter route every 12 (twelve) hours.   valACYclovir 500 MG tablet Commonly known as: VALTREX Take 500 mg by mouth 2 (two) times daily as needed (outbreaks).   Vitamin D (Ergocalciferol) 1.25 MG (50000 UNIT) Caps capsule Commonly known as: DRISDOL Take 50,000 Units by mouth every 7 (seven) days.         Discharge Instructions/ Education: Discharge instructions given to patient/family with verbalized understanding. Discharge education completed with patient/family including: follow up instructions, medication list, discharge activities, and limitations if indicated.   Additional discharge instructions as indicated by discharging provider also reviewed. Patient and family able to verbalize understanding, all questions fully answered. Patient instructed to return to Emergency Department, call 911, or call MD for any changes in condition.   Patient escorted via wheelchair to lobby and discharged home via private automobile.

## 2020-10-11 LAB — AMYLASE, BODY FLUID (OTHER)

## 2020-10-13 LAB — AEROBIC/ANAEROBIC CULTURE W GRAM STAIN (SURGICAL/DEEP WOUND)
Culture: NO GROWTH
Gram Stain: NONE SEEN

## 2021-10-20 ENCOUNTER — Other Ambulatory Visit: Payer: Self-pay | Admitting: Surgery

## 2021-10-20 DIAGNOSIS — M6740 Ganglion, unspecified site: Secondary | ICD-10-CM

## 2021-10-20 DIAGNOSIS — C49A2 Gastrointestinal stromal tumor of stomach: Secondary | ICD-10-CM

## 2021-11-20 ENCOUNTER — Ambulatory Visit
Admission: RE | Admit: 2021-11-20 | Discharge: 2021-11-20 | Disposition: A | Discharge status: Home / Self Care | Payer: Medicaid Other | Source: Ambulatory Visit | Attending: Surgery | Admitting: Surgery

## 2021-11-20 DIAGNOSIS — M6740 Ganglion, unspecified site: Secondary | ICD-10-CM

## 2021-11-20 DIAGNOSIS — C49A2 Gastrointestinal stromal tumor of stomach: Secondary | ICD-10-CM

## 2021-11-20 MED ORDER — IOPAMIDOL (ISOVUE-300) INJECTION 61%
100.0000 mL | Freq: Once | INTRAVENOUS | Status: AC | PRN
  Administered 2021-11-20: 100 mL via INTRAVENOUS

## 2023-07-22 IMAGING — CT CT IMAGE GUIDED FLUID DRAIN BY CATHETER
1 of 6 series · 12 of 32 positions shown, 18 images · non-contrast
Comparison: none

INDICATION: 50-year-old with recent distal pancreatectomy and splenectomy. CT
imaging demonstrates a large postoperative fluid collection in the
upper abdomen. Surgery request percutaneous drainage of this
collection.

[Series 2: i-spiral 5.0 bf37 · axial · 0.96mm/px · z∈[-192,+0]mm · 12 of 66 slices shown, 18 images]
[im 6/66  soft-tissue]
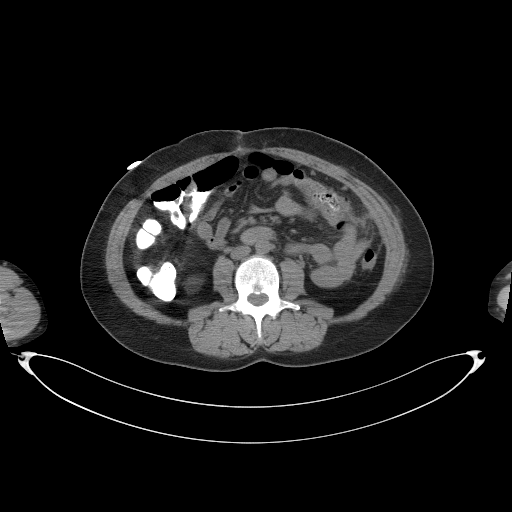
[im 6/66  bone]
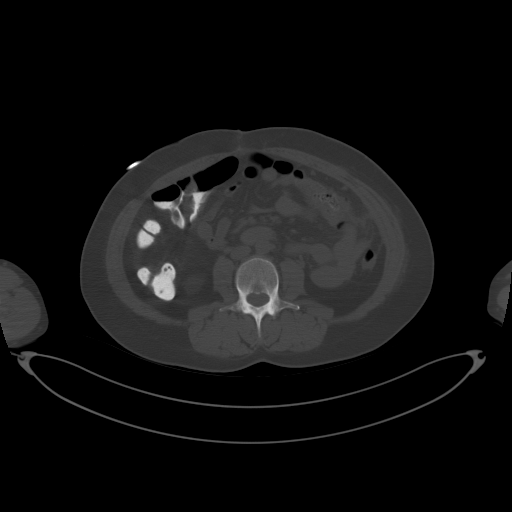
[im 11/66  soft-tissue]
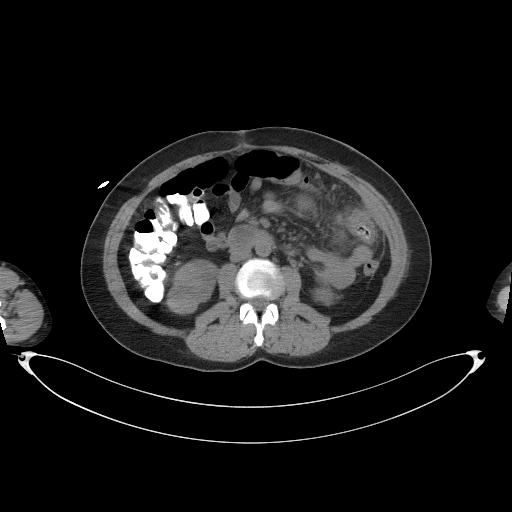
[im 16/66  soft-tissue]
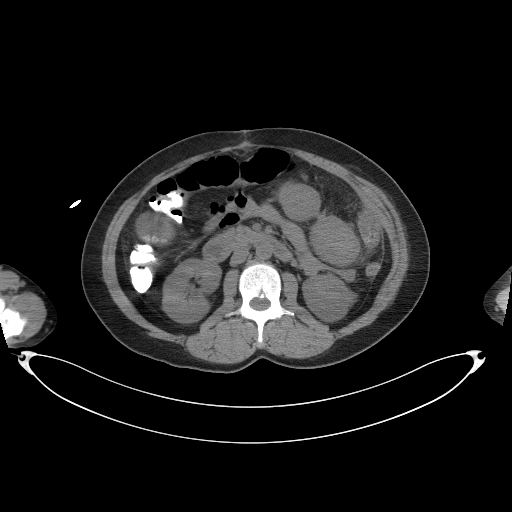
[im 21/66  soft-tissue]
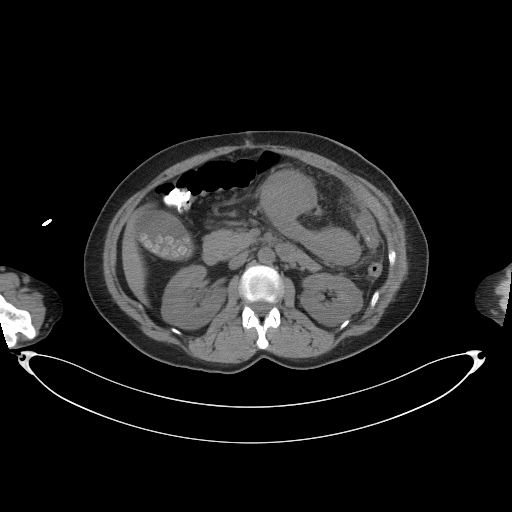
[im 26/66  soft-tissue]
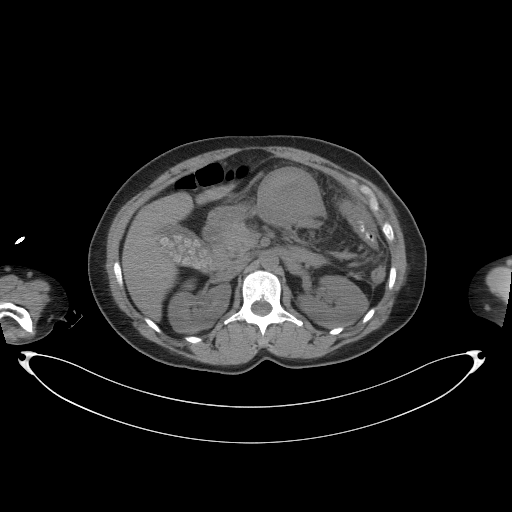
[im 31/66  soft-tissue]
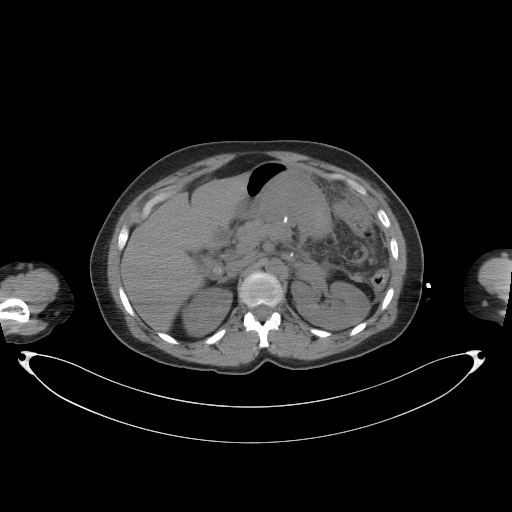
[im 36/66  soft-tissue]
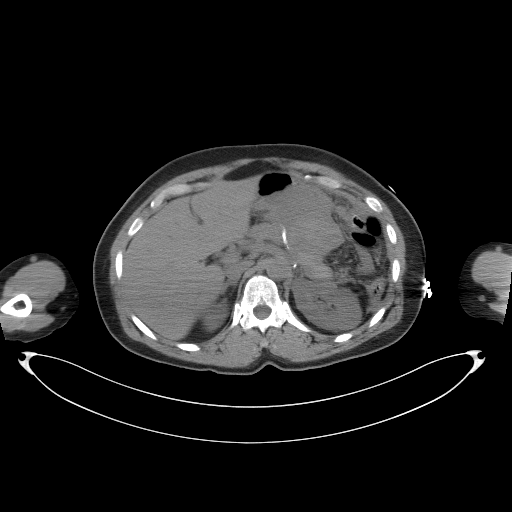
[im 41/66  soft-tissue]
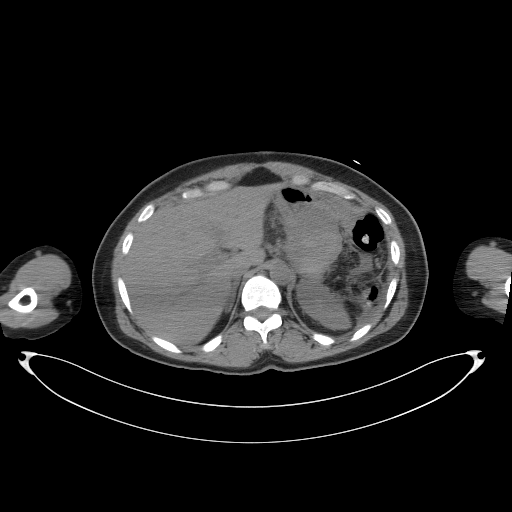
[im 46/66  soft-tissue]
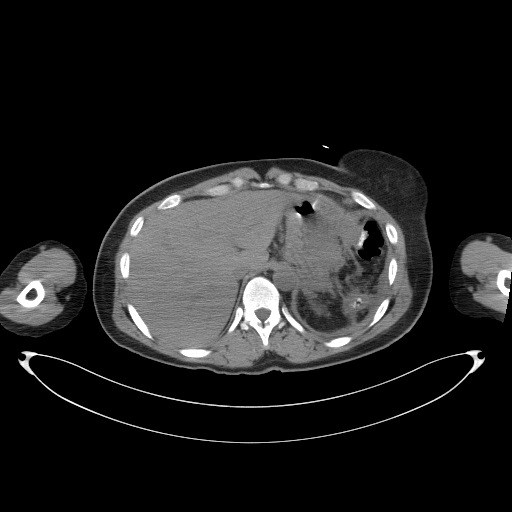
[im 46/66  lung]
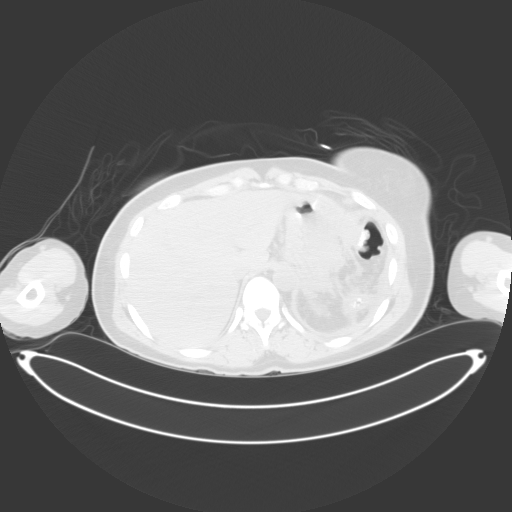
[im 46/66  bone]
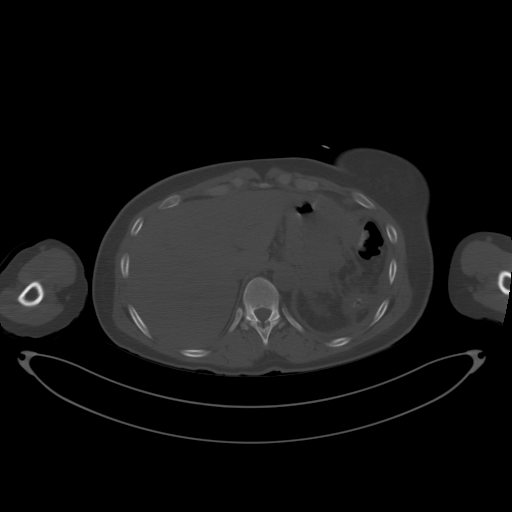
[im 51/66  soft-tissue]
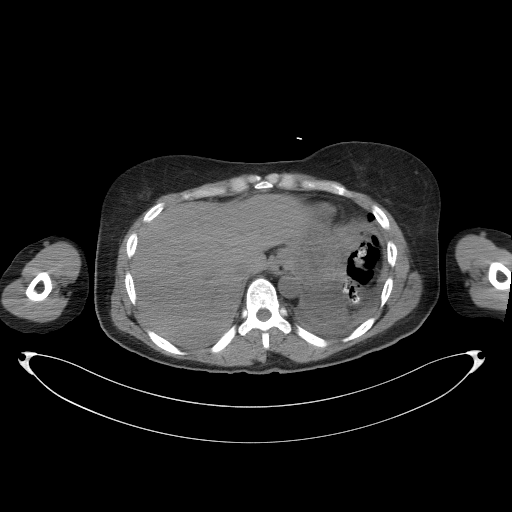
[im 51/66  lung]
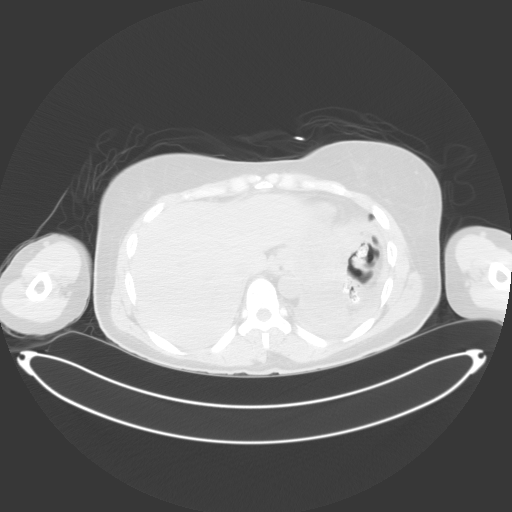
[im 56/66  soft-tissue]
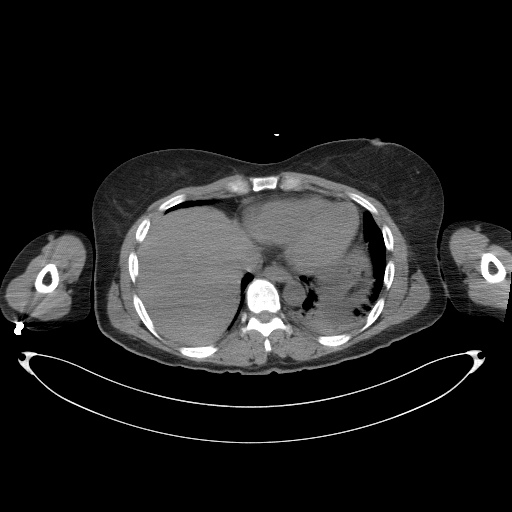
[im 56/66  lung]
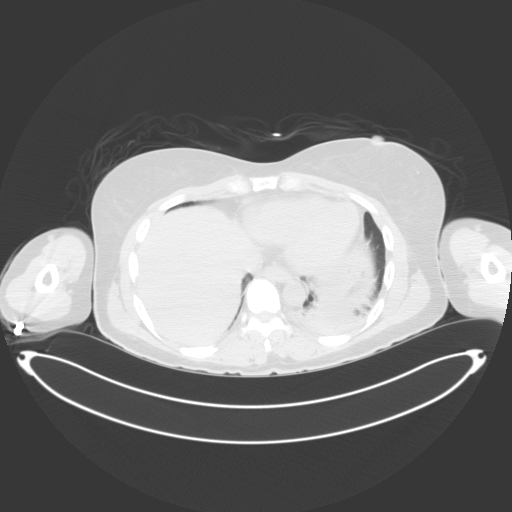
[im 61/66  soft-tissue]
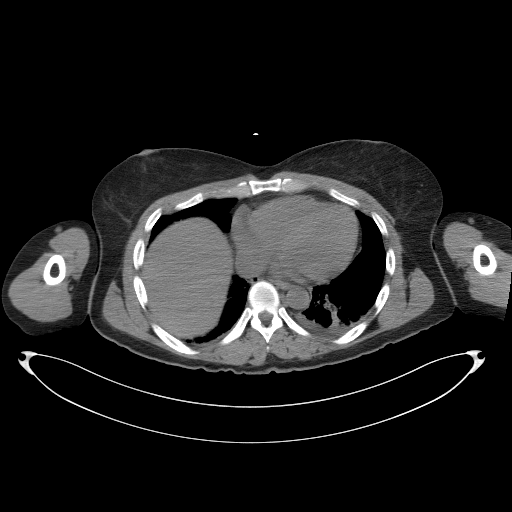
[im 61/66  lung]
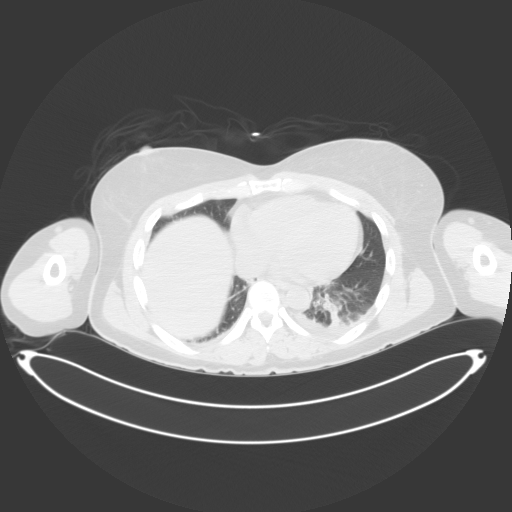

[12 of 32 positions shown; findings below may reference images not displayed]

EXAM:
CT-guided drainage of upper abdominal fluid collection

MEDICATIONS:
Moderate sedation

ANESTHESIA/SEDATION:
Fentanyl 100 mcg IV; Versed 4.0 mg IV

Moderate Sedation Time:  36 minutes

The patient was continuously monitored during the procedure by the
interventional radiology nurse under my direct supervision.

COMPLICATIONS:
None immediate.

PROCEDURE:
Informed written consent was obtained from the patient after a
thorough discussion of the procedural risks, benefits and
alternatives. All questions were addressed. A timeout was performed
prior to the initiation of the procedure.

Patient was placed supine on CT scanner. Images through the abdomen
were obtained. The large collection in the anterior upper abdomen
was identified and targeted. The anterior abdomen was prepped and
draped in sterile fashion. Maximal barrier sterile technique was
utilized including caps, mask, sterile gowns, sterile gloves,
sterile drape, hand hygiene and skin antiseptic. Skin was
anesthetized using 1% lidocaine. A small incision was made. Using CT
guidance, an 18 gauge trocar needle was directed into this
collection. Dark bloody fluid was aspirated. A superstiff Amplatz
wire was advanced into the collection and the tract was dilated to
accommodate a 12 French multipurpose drain. Approximately 50 mL of
dark bloody fluid was aspirated. Follow up CT images were obtained.
Catheter was sutured to skin. Dressing was placed.
FINDINGS: Again noted is a large fluid collection in the anterior upper
abdomen. Hounsfield units are slightly dense measuring over 30. Dark
bloody fluid was removed from the collection. 50 mL of bloody fluid
was removed. Drain was coiled in the anterior portion of the
collection at the end of the procedure. Residual fluid remaining
within the collection at the end of the procedure.
IMPRESSION: CT-guided drainage of the anterior abdominal fluid collection.
Findings are suggestive for a postoperative hematoma. Dark bloody
fluid was removed. Fluid was sent for amylase and culture.

## 2023-10-22 ENCOUNTER — Encounter (HOSPITAL_BASED_OUTPATIENT_CLINIC_OR_DEPARTMENT_OTHER): Payer: Self-pay | Admitting: Internal Medicine

## 2023-10-22 DIAGNOSIS — G4733 Obstructive sleep apnea (adult) (pediatric): Secondary | ICD-10-CM

## 2023-12-24 ENCOUNTER — Ambulatory Visit (HOSPITAL_BASED_OUTPATIENT_CLINIC_OR_DEPARTMENT_OTHER): Payer: MEDICAID | Admitting: Internal Medicine

## 2024-02-27 ENCOUNTER — Ambulatory Visit (HOSPITAL_BASED_OUTPATIENT_CLINIC_OR_DEPARTMENT_OTHER): Payer: MEDICAID | Admitting: Internal Medicine
